# Patient Record
Sex: Female | Born: 1980 | Race: White | Hispanic: No | Marital: Single | Smoking: Current every day smoker
Health system: Southern US, Community
[De-identification: ages and names within clinical notes are randomized; demographics above are authoritative.]

## PROBLEM LIST (undated history)

## (undated) DIAGNOSIS — F419 Anxiety disorder, unspecified: Secondary | ICD-10-CM

## (undated) DIAGNOSIS — F32A Depression, unspecified: Secondary | ICD-10-CM

## (undated) DIAGNOSIS — F329 Major depressive disorder, single episode, unspecified: Secondary | ICD-10-CM

## (undated) DIAGNOSIS — F1120 Opioid dependence, uncomplicated: Secondary | ICD-10-CM

## (undated) DIAGNOSIS — G40909 Epilepsy, unspecified, not intractable, without status epilepticus: Secondary | ICD-10-CM

## (undated) HISTORY — PX: NO PAST SURGERIES: SHX2092

## (undated) HISTORY — DX: Opioid dependence, uncomplicated: F11.20

---

## 2005-07-31 ENCOUNTER — Emergency Department: Payer: Self-pay | Admitting: Unknown Physician Specialty

## 2006-03-10 ENCOUNTER — Emergency Department: Payer: Self-pay | Admitting: Emergency Medicine

## 2006-04-20 ENCOUNTER — Emergency Department: Payer: Self-pay | Admitting: Emergency Medicine
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
## 2008-05-23 ENCOUNTER — Ambulatory Visit: Payer: Self-pay | Admitting: Internal Medicine

## 2008-07-10 ENCOUNTER — Ambulatory Visit: Payer: Self-pay | Admitting: Internal Medicine

## 2008-07-12 ENCOUNTER — Emergency Department: Payer: Self-pay | Admitting: Emergency Medicine

## 2009-08-02 ENCOUNTER — Emergency Department: Payer: Self-pay | Admitting: Emergency Medicine

## 2010-09-12 ENCOUNTER — Emergency Department: Payer: Self-pay | Admitting: Internal Medicine

## 2010-09-17 ENCOUNTER — Emergency Department: Payer: Self-pay | Admitting: Emergency Medicine

## 2010-10-05 ENCOUNTER — Emergency Department: Payer: Self-pay | Admitting: Internal Medicine

## 2011-05-01 ENCOUNTER — Emergency Department: Payer: Self-pay | Admitting: Emergency Medicine

## 2011-12-01 ENCOUNTER — Emergency Department: Payer: Self-pay | Admitting: Emergency Medicine

## 2011-12-01 LAB — URINALYSIS, COMPLETE
Bacteria: NONE SEEN
Bilirubin,UR: NEGATIVE
Glucose,UR: NEGATIVE mg/dL (ref 0–75)
Nitrite: NEGATIVE
Ph: 7 (ref 4.5–8.0)
RBC,UR: 3 /HPF (ref 0–5)
Squamous Epithelial: 5

## 2012-12-25 ENCOUNTER — Emergency Department: Payer: Self-pay | Admitting: Emergency Medicine

## 2012-12-25 LAB — DRUG SCREEN, URINE
Amphetamines, Ur Screen: NEGATIVE (ref ?–1000)
Barbiturates, Ur Screen: NEGATIVE (ref ?–200)
Benzodiazepine, Ur Scrn: NEGATIVE (ref ?–200)
Cannabinoid 50 Ng, Ur ~~LOC~~: POSITIVE (ref ?–50)
Methadone, Ur Screen: NEGATIVE (ref ?–300)
Opiate, Ur Screen: POSITIVE (ref ?–300)
Phencyclidine (PCP) Ur S: NEGATIVE (ref ?–25)
Tricyclic, Ur Screen: NEGATIVE (ref ?–1000)

## 2012-12-25 LAB — COMPREHENSIVE METABOLIC PANEL
Albumin: 3.9 g/dL (ref 3.4–5.0)
Alkaline Phosphatase: 95 U/L (ref 50–136)
Anion Gap: 3 — ABNORMAL LOW (ref 7–16)
BUN: 14 mg/dL (ref 7–18)
Bilirubin,Total: 0.2 mg/dL (ref 0.2–1.0)
Calcium, Total: 8.9 mg/dL (ref 8.5–10.1)
Chloride: 105 mmol/L (ref 98–107)
Creatinine: 0.92 mg/dL (ref 0.60–1.30)
EGFR (African American): >60
EGFR (Non-African Amer.): >60
Glucose: 91 mg/dL (ref 65–99)
Osmolality: 278 (ref 275–301)
Potassium: 4.2 mmol/L (ref 3.5–5.1)
SGPT (ALT): 17 U/L (ref 12–78)
Sodium: 139 mmol/L (ref 136–145)

## 2012-12-25 LAB — CBC
HGB: 12.1 g/dL (ref 12.0–16.0)
MCH: 28.8 pg (ref 26.0–34.0)
MCHC: 33.6 g/dL (ref 32.0–36.0)
MCV: 86 fL (ref 80–100)
Platelet: 345 10*3/uL (ref 150–440)
RBC: 4.2 10*6/uL (ref 3.80–5.20)

## 2012-12-25 LAB — URINALYSIS, COMPLETE
Bilirubin,UR: NEGATIVE
Blood: NEGATIVE
Ketone: NEGATIVE
Protein: NEGATIVE
WBC UR: 2 /HPF (ref 0–5)

## 2012-12-25 LAB — PREGNANCY, URINE: Pregnancy Test, Urine: NEGATIVE m[IU]/mL

## 2012-12-25 LAB — ETHANOL: Ethanol %: <0.003 % (ref 0.000–0.080)

## 2013-03-24 IMAGING — CT CT HEAD WITHOUT CONTRAST
1 series · 16 of 30 positions shown, 20 images · non-contrast
Comparison: none

REASON FOR EXAM: fall dizzy
COMMENTS:   May transport without cardiac monitor

[Series 2: soft tissue · axial · 0.42mm/px · z∈[-148,-14]mm · 16 of 30 slices shown, 20 images]
[im 2/30  brain]
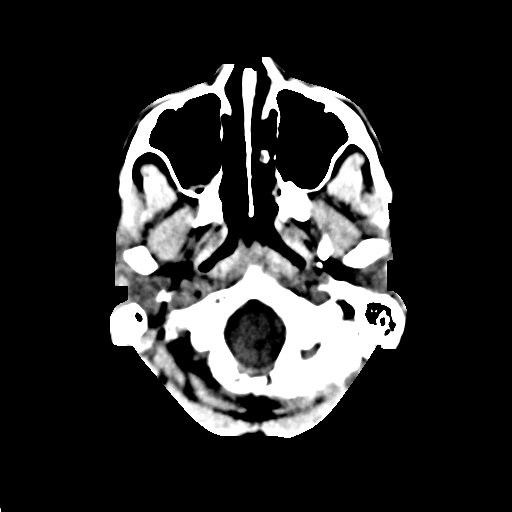
[im 2/30  bone]
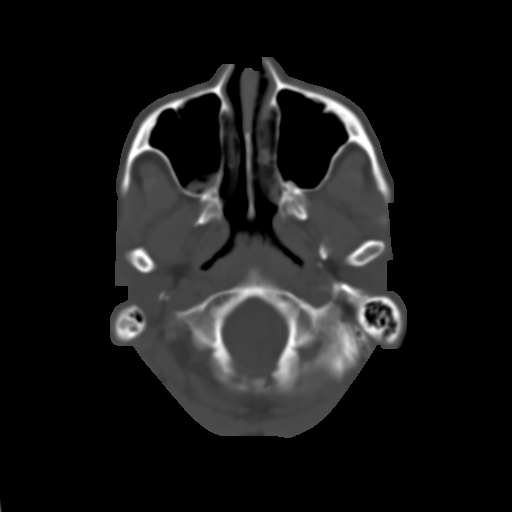
[im 4/30  brain]
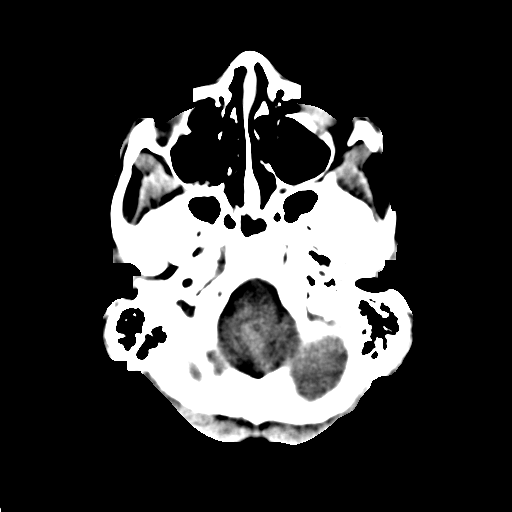
[im 6/30  brain]
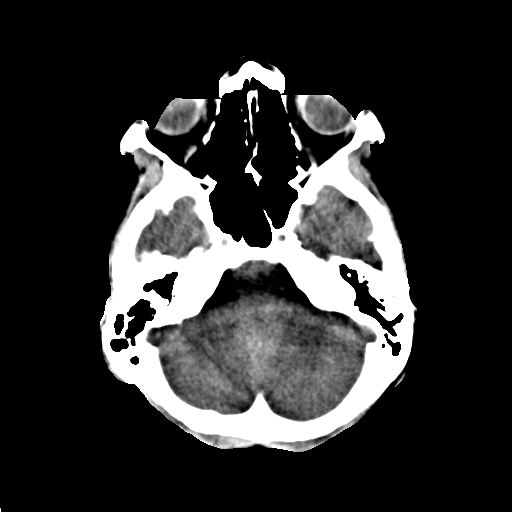
[im 8/30  brain]
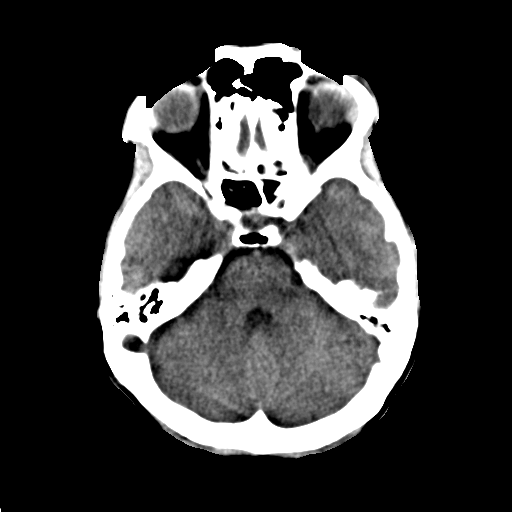
[im 9/30  brain]
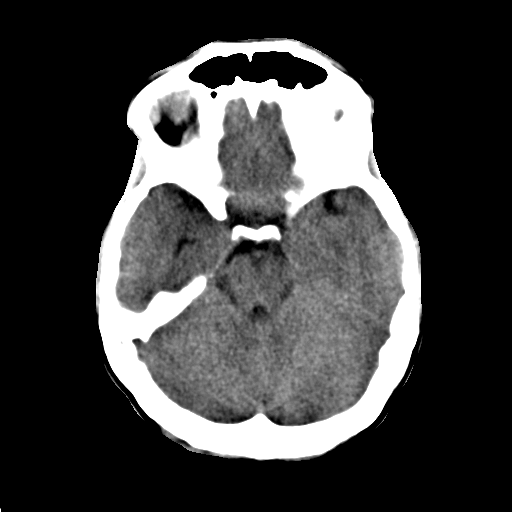
[im 9/30  bone]
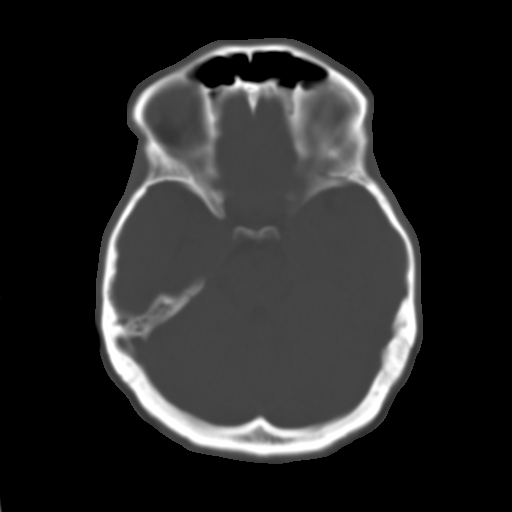
[im 11/30  brain]
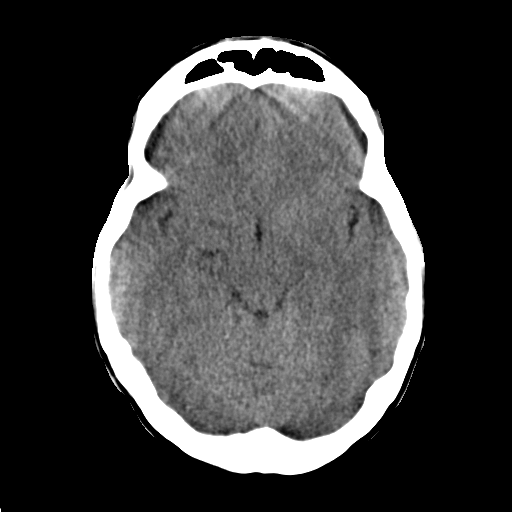
[im 13/30  brain]
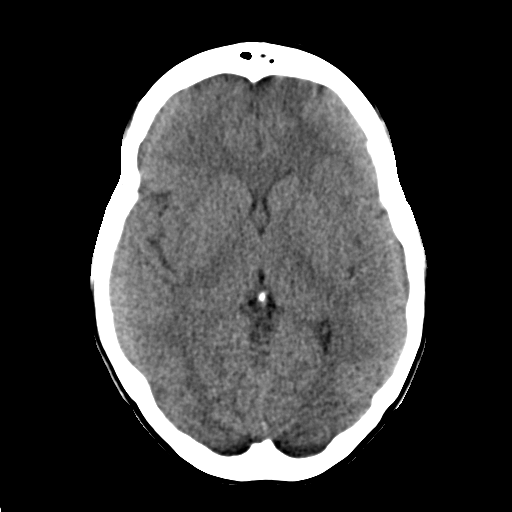
[im 15/30  brain]
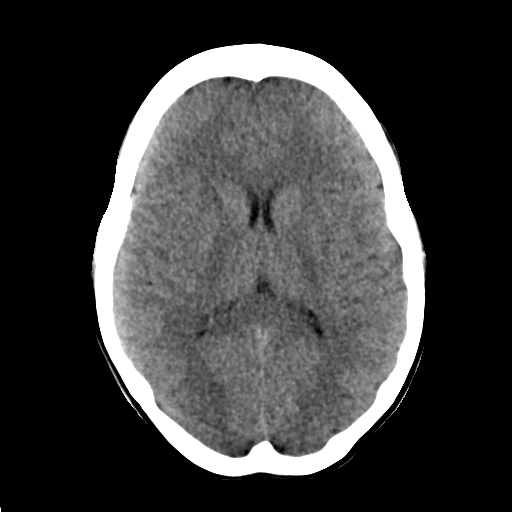
[im 16/30  brain]
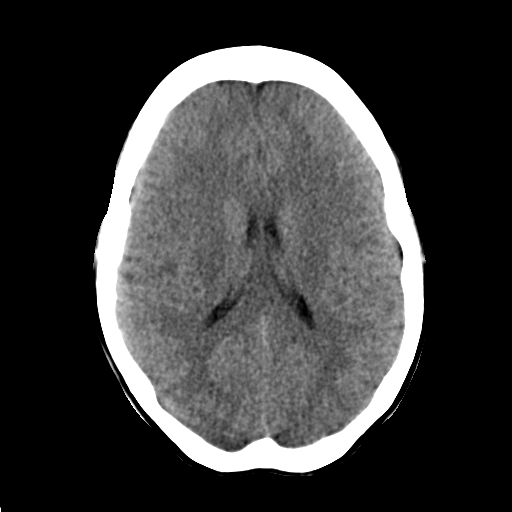
[im 16/30  bone]
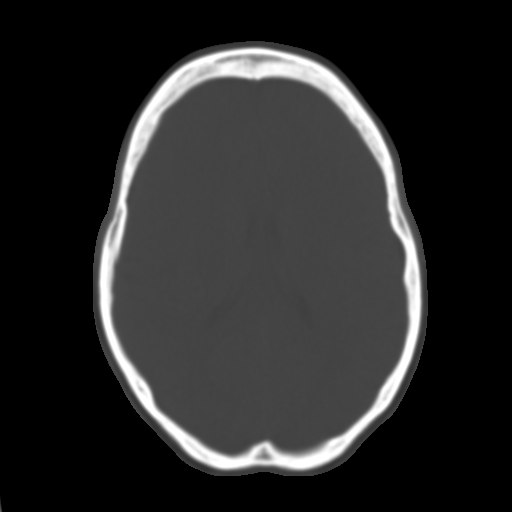
[im 18/30  brain]
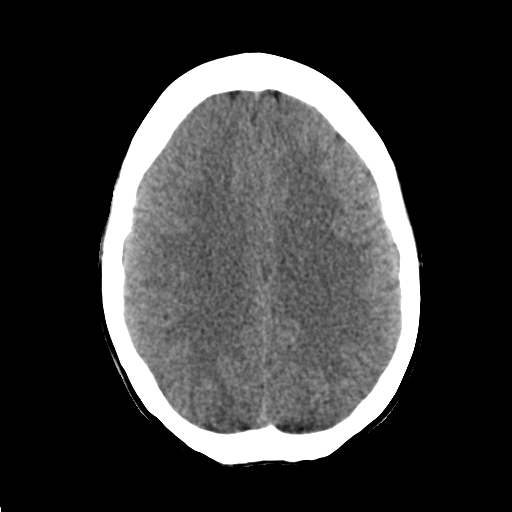
[im 20/30  brain]
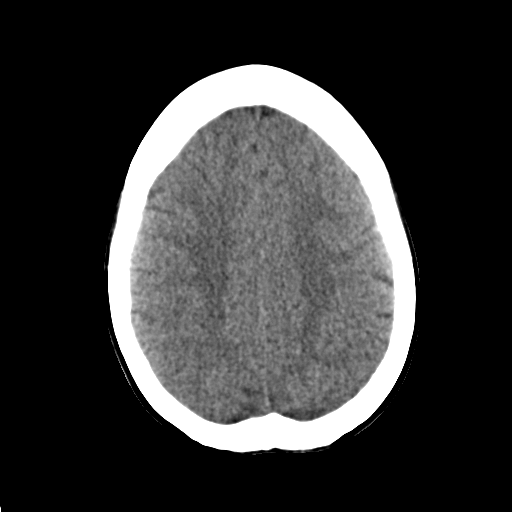
[im 22/30  brain]
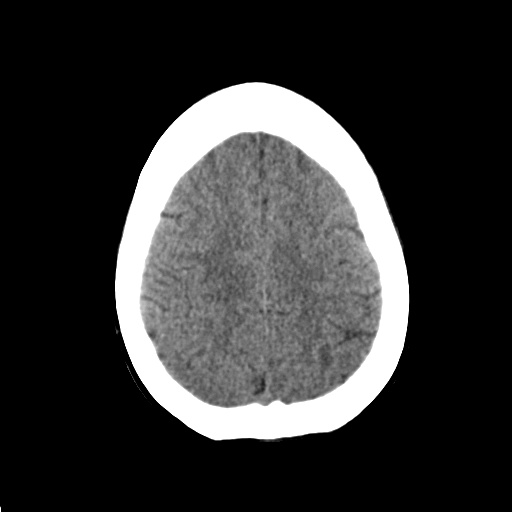
[im 23/30  brain]
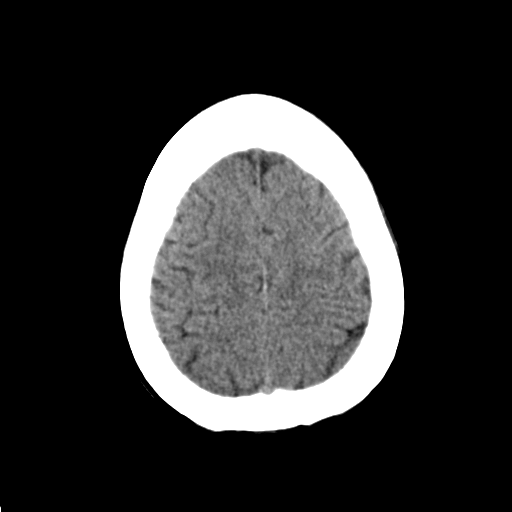
[im 23/30  bone]
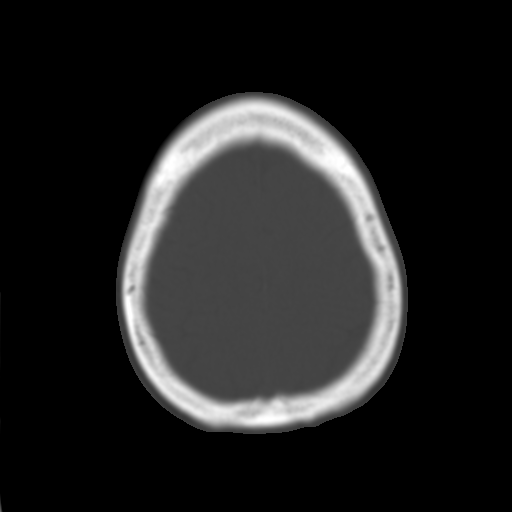
[im 25/30  brain]
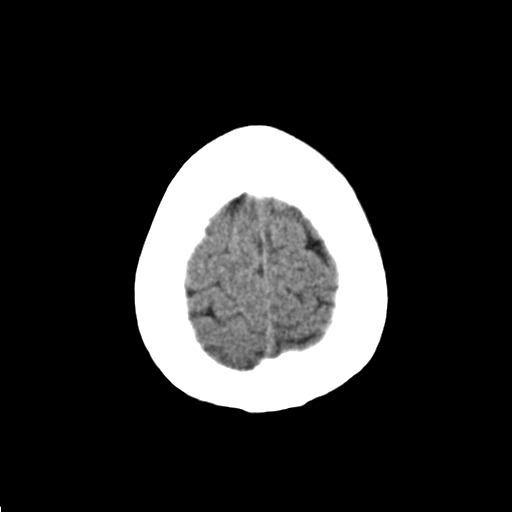
[im 27/30  brain]
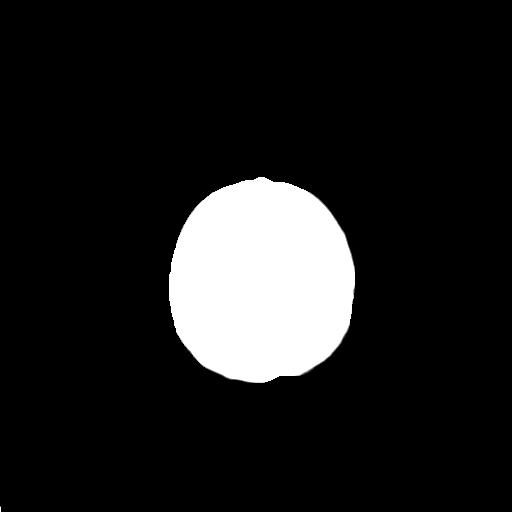
[im 29/30  brain]
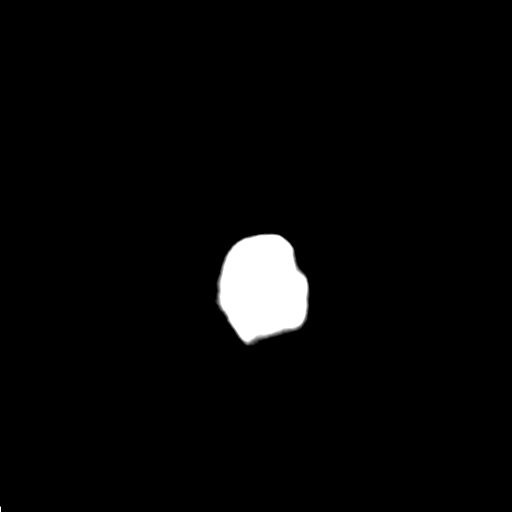

[16 of 30 positions shown; findings below may reference images not displayed]

PROCEDURE:     CT  - CT HEAD WITHOUT CONTRAST  - May 01, 2011  [DATE]

RESULT:     Axial noncontrast CT scanning was performed through the brain
with reconstructions at 5 mm intervals and slice thicknesses.

The ventricles are normal in size and position. There is no intracranial
hemorrhage nor intracranial mass effect. There are no abnormal intracranial
calcifications. There is no evidence of an evolving ischemic infarction. The
cerebellum and brainstem are normal in density.

At bone window settings I do not see evidence of an acute skull fracture.
The nasal bone is grossly intact. There is soft tissue density material
posteriorly in the right maxillary sinus.
IMPRESSION: I see no acute intracranial abnormality.

## 2013-07-22 ENCOUNTER — Emergency Department: Payer: Self-pay | Admitting: Emergency Medicine

## 2013-07-25 ENCOUNTER — Emergency Department: Payer: Self-pay | Admitting: Emergency Medicine

## 2013-07-29 ENCOUNTER — Emergency Department: Payer: Self-pay

## 2013-07-29 LAB — CBC
HCT: 38.6 % (ref 35.0–47.0)
HGB: 12.2 g/dL (ref 12.0–16.0)
MCH: 24.5 pg — AB (ref 26.0–34.0)
MCHC: 31.6 g/dL — AB (ref 32.0–36.0)
MCV: 77 fL — ABNORMAL LOW (ref 80–100)
Platelet: 304 10*3/uL (ref 150–440)
RBC: 4.99 10*6/uL (ref 3.80–5.20)
RDW: 17.2 % — ABNORMAL HIGH (ref 11.5–14.5)
WBC: 6.9 10*3/uL (ref 3.6–11.0)

## 2013-07-29 LAB — COMPREHENSIVE METABOLIC PANEL
Albumin: 3.5 g/dL (ref 3.4–5.0)
Alkaline Phosphatase: 79 U/L
Anion Gap: 6 — ABNORMAL LOW (ref 7–16)
BILIRUBIN TOTAL: 0.3 mg/dL (ref 0.2–1.0)
BUN: 13 mg/dL (ref 7–18)
CALCIUM: 9.2 mg/dL (ref 8.5–10.1)
CHLORIDE: 105 mmol/L (ref 98–107)
CO2: 28 mmol/L (ref 21–32)
Creatinine: 0.8 mg/dL (ref 0.60–1.30)
EGFR (African American): >60
EGFR (Non-African Amer.): >60
GLUCOSE: 103 mg/dL — AB (ref 65–99)
Osmolality: 278 (ref 275–301)
Potassium: 3.2 mmol/L — ABNORMAL LOW (ref 3.5–5.1)
SGOT(AST): 66 U/L — ABNORMAL HIGH (ref 15–37)
SGPT (ALT): 75 U/L (ref 12–78)
Sodium: 139 mmol/L (ref 136–145)
TOTAL PROTEIN: 7.3 g/dL (ref 6.4–8.2)

## 2013-07-29 LAB — URINALYSIS, COMPLETE
Bacteria: NONE SEEN
Bilirubin,UR: NEGATIVE
Glucose,UR: NEGATIVE mg/dL (ref 0–75)
Ketone: NEGATIVE
LEUKOCYTE ESTERASE: NEGATIVE
NITRITE: NEGATIVE
PH: 5 (ref 4.5–8.0)
Protein: 30
RBC,UR: 86 /HPF (ref 0–5)
Specific Gravity: 1.024 (ref 1.003–1.030)
Squamous Epithelial: 10

## 2013-07-29 LAB — DRUG SCREEN, URINE
AMPHETAMINES, UR SCREEN: NEGATIVE (ref ?–1000)
Barbiturates, Ur Screen: NEGATIVE (ref ?–200)
Benzodiazepine, Ur Scrn: POSITIVE (ref ?–200)
Cannabinoid 50 Ng, Ur ~~LOC~~: POSITIVE (ref ?–50)
Cocaine Metabolite,Ur ~~LOC~~: POSITIVE (ref ?–300)
MDMA (Ecstasy)Ur Screen: NEGATIVE (ref ?–500)
Methadone, Ur Screen: POSITIVE (ref ?–300)
Opiate, Ur Screen: POSITIVE (ref ?–300)
PHENCYCLIDINE (PCP) UR S: NEGATIVE (ref ?–25)
TRICYCLIC, UR SCREEN: NEGATIVE (ref ?–1000)

## 2013-07-29 LAB — ETHANOL

## 2013-07-29 LAB — SALICYLATE LEVEL: Salicylates, Serum: 2 mg/dL

## 2013-07-29 LAB — ACETAMINOPHEN LEVEL

## 2013-10-30 ENCOUNTER — Ambulatory Visit (HOSPITAL_COMMUNITY)
Admission: AD | Admit: 2013-10-30 | Discharge: 2013-10-30 | Disposition: A | Discharge status: Home / Self Care | Payer: Self-pay | Source: Other Acute Inpatient Hospital | Attending: Internal Medicine | Admitting: Internal Medicine

## 2013-10-30 ENCOUNTER — Inpatient Hospital Stay: Payer: Self-pay | Admitting: Internal Medicine

## 2013-10-30 DIAGNOSIS — L02519 Cutaneous abscess of unspecified hand: Secondary | ICD-10-CM | POA: Insufficient documentation

## 2013-10-30 DIAGNOSIS — L03119 Cellulitis of unspecified part of limb: Principal | ICD-10-CM

## 2013-10-30 LAB — PLATELET COUNT: Platelet: 184 10*3/uL (ref 150–440)

## 2013-10-30 LAB — CREATININE, SERUM
CREATININE: 0.9 mg/dL (ref 0.60–1.30)
EGFR (African American): >60
EGFR (Non-African Amer.): >60

## 2013-10-30 LAB — VANCOMYCIN, TROUGH: Vancomycin, Trough: 15 ug/mL (ref 10–20)

## 2013-10-31 LAB — CBC WITH DIFFERENTIAL/PLATELET
BASOS ABS: 1 %
Bands: 4 %
Eosinophil: 1 %
HCT: 35.2 % (ref 35.0–47.0)
HGB: 11.2 g/dL — ABNORMAL LOW (ref 12.0–16.0)
LYMPHS PCT: 46 %
MCH: 24.3 pg — ABNORMAL LOW (ref 26.0–34.0)
MCHC: 31.8 g/dL — AB (ref 32.0–36.0)
MCV: 77 fL — AB (ref 80–100)
MONOS PCT: 4 %
Platelet: 170 10*3/uL (ref 150–440)
RBC: 4.61 10*6/uL (ref 3.80–5.20)
RDW: 19.1 % — AB (ref 11.5–14.5)
SEGMENTED NEUTROPHILS: 35 %
Variant Lymphocyte - H1-Rlymph: 9 %
WBC: 6.3 10*3/uL (ref 3.6–11.0)

## 2013-10-31 LAB — BASIC METABOLIC PANEL
ANION GAP: 2 — AB (ref 7–16)
BUN: 8 mg/dL (ref 7–18)
CHLORIDE: 110 mmol/L — AB (ref 98–107)
CO2: 26 mmol/L (ref 21–32)
CREATININE: 0.98 mg/dL (ref 0.60–1.30)
Calcium, Total: 8 mg/dL — ABNORMAL LOW (ref 8.5–10.1)
EGFR (African American): >60
EGFR (Non-African Amer.): >60
Glucose: 88 mg/dL (ref 65–99)
Osmolality: 273 (ref 275–301)
Potassium: 3.9 mmol/L (ref 3.5–5.1)
Sodium: 138 mmol/L (ref 136–145)

## 2013-11-01 LAB — VANCOMYCIN, TROUGH: VANCOMYCIN, TROUGH: 19 ug/mL (ref 10–20)

## 2013-11-05 LAB — CULTURE, BLOOD (SINGLE)

## 2014-06-26 NOTE — Discharge Summary (Signed)
PATIENT NAME:  Kimberly Hayes, Kimberly Hayes MR#:  [id number] DATE OF BIRTH:  [birth year]  DATE OF ADMISSION:  10/30/2013  DATE OF DISCHARGE:  11/01/2013   DISCHARGE DIAGNOSES:  1.  Right arm antecubital fossa abscess.  2.  Sepsis.  3.  IV drug use.  4.  Tobacco use.  5.  Mild anemia.   IMAGING STUDIES: None.   ADMITTING HISTORY AND PHYSICAL:  Please see detailed H and P dictated by Dr. Juliene PinaMody. In brief, a 34 year old female patient with history of IV drug use, mild anemia, admitted [facility] the hospital [facility] a [facility] abscess.  The patient was found to have sepsis along with right arm abscess secondary to IV drug use and was transferred here secondary to proximity from her home and to family.   HOSPITAL COURSE:   Right arm abscess. This is secondary to IV drug use. The patient was started on IV vancomycin and Zosyn.  Blood cultures 1 out of 2 and [facility] were positive for gram-positive cocci initially.  Zosyn was discontinued. She was continued on vancomycin. I have contacted the lab on day of discharge.  She does have a micrococcus growing out in 1 out of 2 bottles, which is thought to be possibly contamination.  Repeat blood cultures were drawn here, which are no growth to date.  Also, her wound culture do not have any growth.   At this point, I will switch her to Bactrim for another week.  She does not have any discharge.  Her abscess has resolved and she will follow up with primary care physician in a week.  The patient has been given information regarding the rehabilitation for her IV drug use after discussing with the case manager.  She has been counseled to quit smoking and to quit IV drugs.    Prior to discharge, the patient is site of I and D on the right upper extremity has no discharge, has closed.  Lungs sound clear. S1, S2 heard without any murmurs.   DISCHARGE MEDICATIONS:  1.  Bactrim DS 1 tablet twice a day for a week.  2.  Tylenol 650 mg oral every  6 hours as needed for pain, fever.  3.  Nicotine transdermal 40 mcg once a day.  4.  Zofran ODT 4 mg oral 3 times a day as needed for nausea or vomiting.  5.  Clonidine 0.1 mg oral 2 times as needed for anxiety and nervousness.  6.  Valium 2 mg oral 3 times a day as needed for anxiety. Baclofen 10 mg oral 3 times as day as needed.   DISCHARGE INSTRUCTIONS:  Activity as tolerated. Follow up with primary care physician in 1 to 2 weeks, quit IV drug use and smoking.   Time spent on day of discharge and discharge activity was 42 minutes.     ____________________________ Molinda BailiffSrikar R. Leita Lindbloom, MD srs:DT D: 11/02/2013 13:01:44 ET T: 11/02/2013 17:51:52 ET JOB#: [id number]  cc: Wardell HeathSrikar R. Haizley Cannella, MD, <Dictator> Orie FishermanSRIKAR R Cordai Rodrigue MD ELECTRONICALLY SIGNED 11/19/2013 16:28

## 2014-06-26 NOTE — H&P (Signed)
PATIENT NAME:  Kimberly Hayes, Kimberly Hayes MR#:  [id number] DATE OF BIRTH:  17-Oct-1980  DATE OF ADMISSION:  10/30/2013  PRIMARY CARE PHYSICIAN: None.     CHIEF COMPLAINT: The patient was sent as a direct admit from [facility] for a right arm abscess.   HISTORY OF PRESENT ILLNESS: This is a 34 year old female with a history of IV drug use, alcohol abuse, tobacco dependence who admitted on Wednesday to [facility] for a right arm abscess. At presentation she had a temperature of 103.1 and was tachycardic. She had no elevation in her white blood cell count. She is been surgically admitted per the patient's request to be closer to her family. Blood cultures so far are 1 out of 2 GPCs. She has been on vancomycin and Zosyn.  The lab phone number in [place] is [phone].   REVIEW OF SYSTEMS: CONSTITUTIONAL: She had a fever on the day of admission and none since. No weakness or fatigue.   EYES: No blurred or double vision, glaucoma, or cataracts.  ENT: No hearing loss, postnasal drip, sinus pain.  RESPIRATORY: No cough, wheezing, hemoptysis, COPD.   CARDIOVASCULAR: No chest pain, orthopnea, edema, arrhythmia, dyspnea on exertion, palpitations, syncope.  GASTROINTESTINAL: Some nausea. No vomiting, diarrhea, abdominal pain, melena, or ulcers.  GENITOURINARY: No dysuria or hematuria.  ENDOCRINE: No polyuria or polydipsia.  HEMATOLOGIC AND LYMPHATIC: No anemia or easy bruising.  SKIN: She has an abscess in the right forearm. No acne or rashes.  MUSCULOSKELETAL: No limited activity, arthritis.  NEUROLOGIC: No history of CVA, TIA, or seizures.  PSYCHIATRIC: No history of anxiety or depression.   PAST MEDICAL HISTORY: 1.  IV drug use.  2.  Tobacco dependence.  3.  Alcohol abuse.   SOCIAL HISTORY: The patient smokes about a pack a day. She uses multiple IV drugs, including heroin, Opana. She occasionally drinks. She was a heavy drinker prior to March.   FAMILY HISTORY: Unknown, as  per the patient.   PAST SURGICAL HISTORY: None.   ALLERGIES: No known drug allergies.   PHYSICAL EXAMINATION:  MOST CURRENT VITAL SIGNS: Temperature is 98.4, pulse 80, blood pressure 110/56, 100% on room air.  GENERAL: The patient is alert, oriented, not in acute distress.  HEENT: Head is atraumatic. Pupils round and reactive. Sclerae anicteric. Mucous membranes are moist. Oropharynx is clear.   NECK: Supple without JVD, carotid bruit, or enlarged thyroid.  CARDIOVASCULAR: Regular rate and rhythm. No murmurs, gallops, or rubs. PMI is not displaced.  LUNGS: Clear to auscultation without crackles, rales, rhonchi, or wheezing. Normal to percussion.  ABDOMEN: Bowel sounds positive. Nontender, nondistended. No hepatosplenomegaly. No rebound or guarding.  EXTREMITIES: No clubbing,  cyanosis, or edema.  NEUROLOGIC: Cranial nerves II through XII are intact. No focal deficits.  SKIN: She has packing in her right arm near the elbow crease. Does not have any purulent discharge. This abscess was incised and drained in the ER on Wednesday in [place].   LABORATORIES: Most recent laboratories from [facility]: Gram stain 1 out of 2 blood cultures positive. Sodium 140, potassium 3.6, chloride 107, bicarbonate 27, BUN 6, creatinine 0.62, calcium 8.5, neutrophils 33.2. White blood cells 4.2, hemoglobin 11, hematocrit 35, platelets are 191,000. Once again, this is from [facility].   ASSESSMENT AND PLAN: A 34 year old female who was transferred from [facility] as per the patient's request, presented on Wednesday with sepsis secondary to IV drug use and a right arm abscess. 1.  Sepsis, seems  to have resolved. The patient is afebrile without any elevated white blood cell count. This is secondary to her right arm abscesses.  2.  Right arm abscess. As per the ER the [facility], she is growing out 1 out of 2 GPCs. She is currently on  vancomycin and Zosyn. Will need to call the microbiology lab there at [phone] for the final blood cultures and sensitivities.  For now, she will continue the Zosyn and vancomycin. She will need home health prior to discharge for the packing.  3.  Intravenous drug use. The patient does want to quit. Her mother apparently is going to help her find a place after discharge to go to for rehabilitation.  4.  Tobacco dependence. The patient was counseled for 4 minutes regarding stopping smoking. She does want to quit. She would like a nicotine patch.  5.  Alcohol abuse. Does not seem to be in withdrawal at this time. Last drink was several days ago, and she says she is not a heavy drinker. Will monitor for withdrawal.  6.  The patient is full code status.   TIME SPENT: Approximately 50 minutes.    ____________________________ Janyth ContesSital P. Juliene PinaMody, MD spm:at D: 10/30/2013 12:46:50 ET T: 10/30/2013 13:15:41 ET JOB#: [id number]  cc: Maximum Reiland P. Juliene PinaMody, MD, <Dictator> Janyth ContesSITAL P Kaileb Monsanto MD ELECTRONICALLY SIGNED 10/30/2013 16:10

## 2014-06-26 NOTE — Consult Note (Signed)
PATIENT NAME:  Kimberly Hayes, Kimberly Hayes MR#:  [id number] DATE OF BIRTH:  12/04/1980  DATE OF CONSULTATION:  07/30/2013  REFERRING PHYSICIAN:  Elaina Hoops, MD. CONSULTING PHYSICIAN:  Ardeen Fillers. Garnetta Buddy, MD.  REASON FOR CONSULTATION: Detox from opioids and heroin and had multiple detox stays in the past.   HISTORY OF PRESENT ILLNESS: The patient is a 35 year old female who presented to the ED as she was requesting detox from drugs, including heroin, pills and cocaine. She reported that she has been using IV drugs for the past couple of months. She stated that she has been using half a gram of cocaine as well as heroin on a daily basis. She was also using pills including Percocet and Opana. Reported that she was clean for six months recently and relapsed on the drugs. Using 2 bags to 2 grams of heroin IV, an average of 1 gram daily. She was also using 80 mg of OxyContin and 3 grams of  Opana  daily.  Stated that she went to the rehab program at [facility] as well as to the [facility] but she was using the drugs over there.   The patient reported that she came to hospital as she wanted help as she was using IV drugs in her breasts and got an abscess. She was given antibiotics, but she is not feeling better. However, now she feels that she wants to be discharged from the hospital as she does not want to stay here any longer. She is looking to get into a longterm rehabilitation program. She stated that she feels depressed, hopeless, helpless at times, but she currently denies having any suicidal or homicidal ideations or plans. The patient reported that she is also having some withdrawal symptoms including feeling restless, nausea, with upset stomach.  She denies having any vomiting. She reported that she has completed 2 to 3 rehabilitation programs, but she was unable to maintain any periods of sobriety. The patient reported that she does not want to stay in the hospital at this time, and she will go back and  is able to handle her own withdrawal symptoms as she has done in the past. She is not interesting in being admitted to the hospital at this time which was offered to her, as she is going into withdrawals from the drugs, but she stated that she will handle it on her own in the community. She is not looking for any medications at this time.   PAST PSYCHIATRIC HISTORY: The patient has history of at least 2 to 3 detox programs in the past including at [address] and [facility]. She reported that she also attended Suboxone clinic at [facility] in February  2014. She was in the methadone clinic in [place], [place] and was no longer able to afford the same clinic. She reported that she wishes to go to a longterm rehabilitation program. She denied any history of suicide attempts in the past.   FAMILY HISTORY: The patient denied any family history of drug use or mental illness.   ALLERGIES: No known drug allergies.   SOCIAL HISTORY: The patient stated that she might be homeless at this time. Reported that she cannot return home, but her parents are currently supportive.   REVIEW OF SYSTEMS:  CONSTITUTIONAL: Denies any fever or chills. No weight changes.  EYES: No double or blurred vision.  RESPIRATORY: No shortness of breath or cough.  CARDIOVASCULAR: No chest pain or orthopnea.  GASTROINTESTINAL: Having some abdominal pain and nausea but  no vomiting or diarrhea.  GENITOURINARY: No incontinence or frequency.  ENDOCRINE: No heat or cold intolerance.  LYMPHATIC: No anemia or easy bruising.  INTEGUMENTARY: No acne or rash.  MUSCULOSKELETAL: Having muscle and joint pain.  NEUROLOGIC: No tingling or weakness.   PHYSICAL EXAMINATION:  VITAL SIGNS: Temperature of 98.3, pulse 80, respirations 18, and blood pressure 123/68.   LABORATORY DATA: Glucose 92, BUN 6, creatinine 0.83, sodium 140, potassium 3.7, chloride 108, bicarbonate 26, anion gap 6, osmolality 276, calcium 9.0. Blood alcohol level less than 3. Protein 8.1,  albumin 4.2, bilirubin 0.6. Alkaline phosphatase 70, AST 62, ALT 38. UDS positive for cocaine, cannabinoids, benzodiazepines, opioids, methadone.  WBC 6.9, RBC 4.9, hemoglobin 12.2, hematocrit 38.6, MCV 77, RDW 17.2.   MENTAL STATUS EXAMINATION: The patient is a moderately built female who was lying in the bed. She appears well nourished. Muscle tone appears normal with no flaccidity. Gait and station were normal. Speech was low in tone and volume. Thought process was logical, goal-directed. Thought content was nondelusional. She denied having any suicidal or homicidal ideations or plans. No loose associations are noted.  Her insight and judgment regarding her drug use was poor. She was awake, alert and oriented x 3. Her recent and remote memory were intact. Her attention span and concentration were normal. Her language including naming objects and repeating phrases is appropriate. Her mood was okay and affect was blunted.   DIAGNOSTIC IMPRESSION: AXIS I: 1. Polysubstance dependence.                2. Opioid withdrawal.  AXIS II: None.  AXIS III: Recent infection of the breast.   TREATMENT PLAN: Offered the patient about admission to the inpatient behavioral health unit as she is currently going withdrawals from the opioids and she is feeling depressed, but she declined the same. She reported that she wants to be discharged at this time as she is looking for admission to a longterm rehabilitation program and she will look into the same. She does not meet the criteria for being involuntary commitment at this time. The patient does not need any medications for her withdrawal symptoms. She will be given information about the [facility] and will be discharged safely from the hospital.   I advised the patient to come back if she noticed worsening of her symptoms and she demonstrated understanding.   Thank you for allowing me to participate in the care of this patient.   ____________________________ Ardeen FillersUzma S.  Garnetta BuddyFaheem, MD usf:sg D: 07/30/2013 13:38:00 ET T: 07/30/2013 13:51:17 ET JOB#: [id number]  cc: Ardeen FillersUzma S. Garnetta BuddyFaheem, MD, <Dictator> Rhunette CroftUZMA S Tabatha Razzano MD ELECTRONICALLY SIGNED 08/04/2013 16:00

## 2014-10-04 DIAGNOSIS — F1120 Opioid dependence, uncomplicated: Secondary | ICD-10-CM

## 2014-10-04 HISTORY — DX: Opioid dependence, uncomplicated: F11.20

## 2016-10-08 DIAGNOSIS — Z653 Problems related to other legal circumstances: Secondary | ICD-10-CM | POA: Insufficient documentation

## 2016-10-08 LAB — HM HIV SCREENING LAB: HM HIV Screening: NEGATIVE

## 2016-10-08 LAB — HM PAP SMEAR: HM Pap smear: NEGATIVE

## 2017-06-18 ENCOUNTER — Ambulatory Visit
Admission: EM | Admit: 2017-06-18 | Discharge: 2017-06-18 | Disposition: A | Discharge status: Home / Self Care | Payer: Managed Care, Other (non HMO) | Attending: Family Medicine | Admitting: Family Medicine

## 2017-06-18 DIAGNOSIS — J029 Acute pharyngitis, unspecified: Secondary | ICD-10-CM

## 2017-06-18 DIAGNOSIS — R5383 Other fatigue: Secondary | ICD-10-CM

## 2017-06-18 HISTORY — DX: Anxiety disorder, unspecified: F41.9

## 2017-06-18 HISTORY — DX: Depression, unspecified: F32.A

## 2017-06-18 HISTORY — DX: Major depressive disorder, single episode, unspecified: F32.9

## 2017-06-18 LAB — RAPID STREP SCREEN (MED CTR MEBANE ONLY): Streptococcus, Group A Screen (Direct): NEGATIVE

## 2017-06-18 MED ORDER — LIDOCAINE VISCOUS 2 % MT SOLN: OROMUCOSAL | 0 refills | Status: DC

## 2017-06-18 NOTE — ED Triage Notes (Signed)
Pt with sore throat x Friday. Now with laryngitis. Pain 4/10

## 2017-06-18 NOTE — ED Provider Notes (Signed)
[facility]    CSN: [id number] Arrival date & time: 06/18/17  1301     History   Chief Complaint Chief Complaint  Patient presents with  . Sore Throat    HPI Kimberly Hayes is a 37 y.o. female.   The history is provided by the patient.  URI  Presenting symptoms: fatigue and sore throat   Severity:  Moderate Onset quality:  Sudden Duration:  4 days Timing:  Constant Progression:  Unchanged Chronicity:  New Relieved by:  Nothing Ineffective treatments:  OTC medications Associated symptoms: no wheezing   Risk factors: sick contacts   Risk factors: not elderly, no chronic cardiac disease, no chronic kidney disease, no chronic respiratory disease, no diabetes mellitus, no immunosuppression, no recent illness and no recent travel     Past Medical History:  Diagnosis Date  . Anxiety   . Depression     There are no active problems to display for this patient.   History reviewed. No pertinent surgical history.  OB History   None      Home Medications    Prior to Admission medications   Medication Sig Start Date End Date Taking? Authorizing Provider  Etonogestrel (NEXPLANON [facility]) Inject into the skin.   Yes [provider]  Buprenorphine HCl-Naloxone HCl 8-2 MG FILM DISSOLVE 1 FILM UNDER THE TONGUE TWO TIMES A DAY 06/17/17   [provider]  citalopram (CELEXA) 40 MG tablet TAKE 1 2 TABLET BY MOUTH TWO TIMES A DAY 05/24/17   [provider]  gabapentin (NEURONTIN) 300 MG capsule Take 300 mg by mouth 3 (three) times daily. 05/24/17   [provider]  lidocaine (XYLOCAINE) 2 % solution 20 ml gargle and spit q 6 hours prn 06/18/17   Payton Mccallum, MD    Family History History reviewed. No pertinent family history.  Social History Social History   Tobacco Use  . Smoking status: Former Games developer  . Smokeless tobacco: Never Used  Substance Use Topics  . Alcohol use: Not Currently    Frequency: Never  . Drug use: Not  Currently     Allergies   Patient has no known allergies.   Review of Systems Review of Systems  Constitutional: Positive for fatigue.  HENT: Positive for sore throat.   Respiratory: Negative for wheezing.      Physical Exam Triage Vital Signs ED Triage Vitals  Enc Vitals Group     BP 06/18/17 1315 116/75     Pulse Rate 06/18/17 1315 68     Resp 06/18/17 1315 18     Temp 06/18/17 1315 98.6 F (37 C)     Temp src --      SpO2 06/18/17 1315 99 %     Weight 06/18/17 1316 182 lb (82.6 kg)     Height 06/18/17 1316 5\' 4"  (1.626 m)     Head Circumference --      Peak Flow --      Pain Score 06/18/17 1316 4     Pain Loc --      Pain Edu? --      Excl. in GC? --    No data found.  Updated Vital Signs BP 116/75 (BP Location: Left Arm)   Pulse 68   Temp 98.6 F (37 C)   Resp 18   Ht 5\' 4"  (1.626 m)   Wt 182 lb (82.6 kg)   SpO2 99%   BMI 31.24 kg/m   Visual Acuity Right Eye Distance:  Left Eye Distance:   Bilateral Distance:    Right Eye Near:   Left Eye Near:    Bilateral Near:     Physical Exam  Constitutional: She appears well-developed and well-nourished. No distress.  HENT:  Head: Normocephalic and atraumatic.  Right Ear: Tympanic membrane, external ear and ear canal normal.  Left Ear: Tympanic membrane, external ear and ear canal normal.  Nose: Mucosal edema and rhinorrhea present. No nose lacerations, sinus tenderness, nasal deformity, septal deviation or nasal septal hematoma. No epistaxis.  No foreign bodies. Right sinus exhibits no maxillary sinus tenderness and no frontal sinus tenderness. Left sinus exhibits no maxillary sinus tenderness and no frontal sinus tenderness.  Mouth/Throat: Uvula is midline and mucous membranes are normal. Posterior oropharyngeal erythema present. No oropharyngeal exudate, posterior oropharyngeal edema or tonsillar abscesses. No tonsillar exudate.  Eyes: Conjunctivae are normal. Right eye exhibits no discharge. Left eye  exhibits no discharge. No scleral icterus.  Neck: Normal range of motion. Neck supple. No thyromegaly present.  Cardiovascular: Normal rate, regular rhythm and normal heart sounds.  Pulmonary/Chest: Effort normal and breath sounds normal. No respiratory distress. She has no wheezes. She has no rales.  Lymphadenopathy:    She has no cervical adenopathy.  Skin: She is not diaphoretic.  Nursing note and vitals reviewed.    UC Treatments / Results  Labs (all labs ordered are listed, but only abnormal results are displayed) Labs Reviewed  RAPID STREP SCREEN ([facility] ONLY)  CULTURE, GROUP A STREP [facility])    EKG None Radiology No results found.  Procedures Procedures (including critical care time)  Medications Ordered in UC Medications - No data to display   Initial Impression / Assessment and Plan / UC Course  I have reviewed the triage vital signs and the nursing notes.  Pertinent labs & imaging results that were available during my care of the patient were reviewed by me and considered in my medical decision making (see chart for details).       Final Clinical Impressions(s) / UC Diagnoses   Final diagnoses:  Viral pharyngitis    ED Discharge Orders        Ordered    lidocaine (XYLOCAINE) 2 % solution     06/18/17 1348     1. Lab result and diagnosis reviewed with patient 2. rx as per orders above; reviewed possible side effects, interactions, risks and benefits  3. Recommend supportive treatment with  4. Follow-up prn if symptoms worsen or don't improve  Controlled Substance Prescriptions [place] Controlled Substance Registry consulted? Not Applicable   Payton Mccallumonty, Damauri Minion, MD 06/18/17 [phone]

## 2017-06-21 LAB — CULTURE, GROUP A STREP (THRC)

## 2017-08-16 ENCOUNTER — Ambulatory Visit: Payer: Managed Care, Other (non HMO) | Admitting: Family Medicine

## 2017-08-16 ENCOUNTER — Other Ambulatory Visit
Admission: RE | Admit: 2017-08-16 | Discharge: 2017-08-16 | Disposition: A | Discharge status: Home / Self Care | Payer: Managed Care, Other (non HMO) | Source: Ambulatory Visit | Attending: Family Medicine | Admitting: Family Medicine

## 2017-08-16 ENCOUNTER — Encounter: Payer: Self-pay | Admitting: Family Medicine

## 2017-08-16 VITALS — BP 112/76 | HR 64 | Resp 16 | Ht 64.0 in | Wt 163.8 lb

## 2017-08-16 DIAGNOSIS — R109 Unspecified abdominal pain: Secondary | ICD-10-CM

## 2017-08-16 DIAGNOSIS — Z87898 Personal history of other specified conditions: Secondary | ICD-10-CM | POA: Diagnosis not present

## 2017-08-16 DIAGNOSIS — Z7689 Persons encountering health services in other specified circumstances: Secondary | ICD-10-CM

## 2017-08-16 DIAGNOSIS — Z8742 Personal history of other diseases of the female genital tract: Secondary | ICD-10-CM

## 2017-08-16 LAB — URINALYSIS, ROUTINE W REFLEX MICROSCOPIC
BILIRUBIN URINE: NEGATIVE
Glucose, UA: NEGATIVE mg/dL
HGB URINE DIPSTICK: NEGATIVE
KETONES UR: 15 mg/dL — AB
Leukocytes, UA: NEGATIVE
NITRITE: NEGATIVE
PROTEIN: NEGATIVE mg/dL
SPECIFIC GRAVITY, URINE: 1.02 (ref 1.005–1.030)
pH: 6 (ref 5.0–8.0)

## 2017-08-16 LAB — CBC WITH DIFFERENTIAL/PLATELET
Basophils Absolute: 0 10*3/uL (ref 0–0.1)
Basophils Relative: 1 %
EOS ABS: 0.1 10*3/uL (ref 0–0.7)
EOS PCT: 2 %
HCT: 38.4 % (ref 35.0–47.0)
Hemoglobin: 13 g/dL (ref 12.0–16.0)
LYMPHS ABS: 1.4 10*3/uL (ref 1.0–3.6)
Lymphocytes Relative: 33 %
MCH: 29.7 pg (ref 26.0–34.0)
MCHC: 33.9 g/dL (ref 32.0–36.0)
MCV: 87.5 fL (ref 80.0–100.0)
MONOS PCT: 5 %
Monocytes Absolute: 0.2 10*3/uL (ref 0.2–0.9)
NEUTROS PCT: 59 %
Neutro Abs: 2.6 10*3/uL (ref 1.4–6.5)
PLATELETS: 180 10*3/uL (ref 150–440)
RBC: 4.39 MIL/uL (ref 3.80–5.20)
RDW: 14.3 % (ref 11.5–14.5)
WBC: 4.4 10*3/uL (ref 3.6–11.0)

## 2017-08-16 NOTE — Progress Notes (Signed)
Name: Kimberly Hayes   MRN: [id number]    DOB: October 09, 1980   Date:08/16/2017       Progress Note  Subjective  Chief Complaint  Chief Complaint  Patient presents with  . Establish Care  . Abdominal Pain    1 month of pain R side after vomiting for 2 days     Patient presents to establish care.  Abdominal Pain  This is a new problem. The current episode started more than 1 month ago. The onset quality is gradual. The problem occurs constantly. The problem has been waxing and waning. The pain is located in the RLQ. The pain is moderate. The quality of the pain is colicky. Associated symptoms include weight loss. Pertinent negatives include no anorexia, arthralgias, belching, constipation, diarrhea, dysuria, fever, flatus, frequency, headaches, hematochezia, hematuria, melena, myalgias, nausea or vomiting. Nothing aggravates the pain. The treatment provided moderate relief.    No problem-specific Assessment & Plan notes found for this encounter.   Past Medical History:  Diagnosis Date  . Anxiety   . Depression   . Opiate addiction (HCC) 10/04/2014   Heroin and Opiate     History reviewed. No pertinent surgical history.  History reviewed. No pertinent family history.  Social History   Socioeconomic History  . Marital status: Single    Spouse name: Not on file  . Number of children: Not on file  . Years of education: Not on file  . Highest education level: Not on file  Occupational History  . Not on file  Social Needs  . Financial resource strain: Not on file  . Food insecurity:    Worry: Patient refused    Inability: Patient refused  . Transportation needs:    Medical: Patient refused    Non-medical: Patient refused  Tobacco Use  . Smoking status: Current Every Day Smoker    Years: 2.00    Types: E-cigarettes    Last attempt to quit: 08/17/2015    Years since quitting: 2.0  . Smokeless tobacco: Never Used  Substance and Sexual Activity  . Alcohol use: Not  Currently    Frequency: Never  . Drug use: Not Currently    Comment: recovering Heroin Addict   . Sexual activity: Not on file  Lifestyle  . Physical activity:    Days per week: Patient refused    Minutes per session: Patient refused  . Stress: Not on file  Relationships  . Social connections:    Talks on phone: Patient refused    Gets together: Patient refused    Attends religious service: Patient refused    Active member of club or organization: Patient refused    Attends meetings of clubs or organizations: Patient refused    Relationship status: Patient refused  . Intimate partner violence:    Fear of current or ex partner: Patient refused    Emotionally abused: Patient refused    Physically abused: Patient refused    Forced sexual activity: Patient refused  Other Topics Concern  . Not on file  Social History Narrative  . Not on file    No Known Allergies  Outpatient Medications Prior to Visit  Medication Sig Dispense Refill  . Buprenorphine HCl-Naloxone HCl 8-2 MG FILM DISSOLVE 1 FILM UNDER THE TONGUE TWO TIMES A DAY  1  . citalopram (CELEXA) 40 MG tablet TAKE 1 2 TABLET BY MOUTH TWO TIMES A DAY  1  . Etonogestrel (NEXPLANON [place]) Inject into the skin.    Marland Kitchen. gabapentin (NEURONTIN) 300  MG capsule Take 300 mg by mouth 3 (three) times daily.  1  . lidocaine (XYLOCAINE) 2 % solution 20 ml gargle and spit q 6 hours prn 100 mL 0   No facility-administered medications prior to visit.     Review of Systems  Constitutional: Positive for weight loss. Negative for chills, fever and malaise/fatigue.  HENT: Negative for ear discharge, ear pain and sore throat.   Eyes: Negative for blurred vision.  Respiratory: Negative for cough, sputum production, shortness of breath and wheezing.   Cardiovascular: Negative for chest pain, palpitations and leg swelling.  Gastrointestinal: Positive for abdominal pain. Negative for anorexia, blood in stool, constipation, diarrhea, flatus,  heartburn, hematochezia, melena, nausea and vomiting.  Genitourinary: Negative for dysuria, frequency, hematuria and urgency.  Musculoskeletal: Negative for arthralgias, back pain, joint pain, myalgias and neck pain.  Skin: Negative for rash.  Neurological: Negative for dizziness, tingling, sensory change, focal weakness and headaches.  Endo/Heme/Allergies: Negative for environmental allergies and polydipsia. Does not bruise/bleed easily.  Psychiatric/Behavioral: Negative for depression and suicidal ideas. The patient is not nervous/anxious and does not have insomnia.      Objective  Vitals:   08/16/17 1449  BP: 112/76  Pulse: 64  Resp: 16  SpO2: 98%  Weight: 163 lb 12.8 oz (74.3 kg)  Height: 5\' 4"  (1.626 m)    Physical Exam  Constitutional: She is oriented to person, place, and time. She appears well-developed and well-nourished.  HENT:  Head: Normocephalic.  Right Ear: External ear normal.  Left Ear: External ear normal.  Mouth/Throat: Oropharynx is clear and moist.  Eyes: Pupils are equal, round, and reactive to light. Conjunctivae and EOM are normal. Lids are everted and swept, no foreign bodies found. Left eye exhibits no hordeolum. No foreign body present in the left eye. Right conjunctiva is not injected. Left conjunctiva is not injected. No scleral icterus.  Neck: Normal range of motion. Neck supple. No JVD present. No tracheal deviation present. No thyromegaly present.  Cardiovascular: Normal rate, regular rhythm, normal heart sounds and intact distal pulses. Exam reveals no gallop and no friction rub.  No murmur heard. Pulmonary/Chest: Effort normal and breath sounds normal. No respiratory distress. She has no wheezes. She has no rales.  Abdominal: Soft. Bowel sounds are normal. She exhibits no shifting dullness, no distension, no abdominal bruit, no pulsatile midline mass and no mass. There is no hepatosplenomegaly, splenomegaly or hepatomegaly. There is tenderness in the  right lower quadrant. There is no rigidity, no rebound, no guarding and no CVA tenderness.  Musculoskeletal: Normal range of motion. She exhibits no edema or tenderness.  Lymphadenopathy:    She has no cervical adenopathy.  Neurological: She is alert and oriented to person, place, and time. She has normal strength. She displays normal reflexes. No cranial nerve deficit.  Skin: Skin is warm. No rash noted.  Psychiatric: She has a normal mood and affect. Her mood appears not anxious. She does not exhibit a depressed mood.  Nursing note and vitals reviewed.     Assessment & Plan  Problem List Items Addressed This Visit    None    Visit Diagnoses    Establishing care with new doctor, encounter for    -  Primary   Right flank pain       Pain noted more than a month. Will schedule pelvic /abd ultrasound will check cbc and screen for chlamydia/gc.   Relevant Orders   CBC with Differential/Platelet   Ambulatory referral to Obstetrics /  Gynecology   Urinalysis, Routine w reflex microscopic   GC/Chlamydia Probe Amp([facility])   HIV antibody (with reflex)   US Abdomen Complete   US Pelvis Complete   Hx of abnormal cervical Pap smear       Last pap 2006 with history of abnormal pap. will refer to [facility] gyn for pap.   Relevant Orders   Ambulatory referral to Obstetrics / Gynecology   US Abdomen Complete   US Pelvis Complete      No orders of the defined types were placed in this encounter.     Dr. Hay[facility]  08/16/17

## 2017-08-18 LAB — MISC LABCORP TEST (SEND OUT): LABCORP TEST CODE: 183194

## 2017-08-19 ENCOUNTER — Other Ambulatory Visit: Payer: Self-pay

## 2017-08-19 DIAGNOSIS — R109 Unspecified abdominal pain: Secondary | ICD-10-CM

## 2017-08-19 DIAGNOSIS — Z8742 Personal history of other diseases of the female genital tract: Secondary | ICD-10-CM

## 2017-08-19 LAB — HIV ANTIBODY (ROUTINE TESTING W REFLEX): HIV Screen 4th Generation wRfx: NONREACTIVE

## 2017-08-27 ENCOUNTER — Ambulatory Visit
Admission: RE | Admit: 2017-08-27 | Discharge: 2017-08-27 | Disposition: A | Discharge status: Home / Self Care | Payer: Managed Care, Other (non HMO) | Source: Ambulatory Visit | Attending: Family Medicine | Admitting: Family Medicine

## 2017-08-27 DIAGNOSIS — Z87898 Personal history of other specified conditions: Secondary | ICD-10-CM | POA: Diagnosis not present

## 2017-08-27 DIAGNOSIS — R109 Unspecified abdominal pain: Secondary | ICD-10-CM | POA: Insufficient documentation

## 2017-08-27 DIAGNOSIS — Z8742 Personal history of other diseases of the female genital tract: Secondary | ICD-10-CM

## 2017-08-28 ENCOUNTER — Other Ambulatory Visit: Payer: Self-pay

## 2017-08-28 DIAGNOSIS — Z8742 Personal history of other diseases of the female genital tract: Secondary | ICD-10-CM

## 2017-11-14 ENCOUNTER — Ambulatory Visit: Payer: Managed Care, Other (non HMO) | Admitting: Family Medicine

## 2017-11-14 ENCOUNTER — Encounter: Payer: Self-pay | Admitting: Family Medicine

## 2017-11-14 VITALS — BP 100/60 | HR 76 | Ht 64.0 in | Wt 154.0 lb

## 2017-11-14 DIAGNOSIS — W540XXA Bitten by dog, initial encounter: Secondary | ICD-10-CM

## 2017-11-14 DIAGNOSIS — S71141D Puncture wound with foreign body, right thigh, subsequent encounter: Secondary | ICD-10-CM | POA: Diagnosis not present

## 2017-11-14 DIAGNOSIS — Z23 Encounter for immunization: Secondary | ICD-10-CM

## 2017-11-14 DIAGNOSIS — S7011XD Contusion of right thigh, subsequent encounter: Secondary | ICD-10-CM

## 2017-11-14 MED ORDER — MUPIROCIN 2 % EX OINT: 1.0000 "application " | TOPICAL_OINTMENT | Freq: Two times a day (BID) | CUTANEOUS | 0 refills | Status: DC

## 2017-11-14 MED ORDER — AMOXICILLIN-POT CLAVULANATE 875-125 MG PO TABS: 1.0000 | ORAL_TABLET | Freq: Two times a day (BID) | ORAL | 0 refills | Status: DC

## 2017-11-14 MED ORDER — IBUPROFEN 800 MG PO TABS: 800.0000 mg | ORAL_TABLET | Freq: Three times a day (TID) | ORAL | 0 refills | Status: DC | PRN

## 2017-11-14 NOTE — Progress Notes (Signed)
Name: Kimberly Hayes   MRN: [id number]    DOB: [birth year]   Date:11/14/2017       Progress Note  Subjective  Chief Complaint  Chief Complaint  Patient presents with  . Animal Bite    can't tell if it was a bite or a claw mork, but has 2 puncture spots and a bruise around area. Occurred Friday the 6th    Animal Bite   The incident occurred more than 2 days ago. The incident occurred at another residence. There is an injury to the right thigh. The pain is moderate. Pertinent negatives include no chest pain, no abdominal pain, no nausea, no headaches, no inability to bear weight, no neck pain, no focal weakness, no tingling and no cough. She has received no recent medical care.    No problem-specific Assessment & Plan notes found for this encounter.   Past Medical History:  Diagnosis Date  . Anxiety   . Depression   . Opiate addiction (HCC) 10/04/2014   Heroin and Opiate     History reviewed. No pertinent surgical history.  History reviewed. No pertinent family history.  Social History   Socioeconomic History  . Marital status: Single    Spouse name: Not on file  . Number of children: Not on file  . Years of education: Not on file  . Highest education level: Not on file  Occupational History  . Not on file  Social Needs  . Financial resource strain: Not on file  . Food insecurity:    Worry: Patient refused    Inability: Patient refused  . Transportation needs:    Medical: Patient refused    Non-medical: Patient refused  Tobacco Use  . Smoking status: Current Every Day Smoker    Years: 2.00    Types: E-cigarettes    Last attempt to quit: 08/17/2015    Years since quitting: 2.2  . Smokeless tobacco: Never Used  . Tobacco comment: smoking cessation discussed  Substance and Sexual Activity  . Alcohol use: Not Currently    Frequency: Never  . Drug use: Not Currently    Comment: recovering Heroin Addict   . Sexual activity: Not on file  Lifestyle  . Physical  activity:    Days per week: Patient refused    Minutes per session: Patient refused  . Stress: Not on file  Relationships  . Social connections:    Talks on phone: Patient refused    Gets together: Patient refused    Attends religious service: Patient refused    Active member of club or organization: Patient refused    Attends meetings of clubs or organizations: Patient refused    Relationship status: Patient refused  . Intimate partner violence:    Fear of current or ex partner: Patient refused    Emotionally abused: Patient refused    Physically abused: Patient refused    Forced sexual activity: Patient refused  Other Topics Concern  . Not on file  Social History Narrative  . Not on file    No Known Allergies  Outpatient Medications Prior to Visit  Medication Sig Dispense Refill  . Buprenorphine HCl-Naloxone HCl 8-2 MG FILM DISSOLVE 1 FILM UNDER THE TONGUE TWO TIMES A DAY  1  . citalopram (CELEXA) 40 MG tablet TAKE 1 2 TABLET BY MOUTH TWO TIMES A DAY  1  . Etonogestrel (NEXPLANON [place]) Inject into the skin.    Marland Kitchen gabapentin (NEURONTIN) 300 MG capsule Take 300 mg by mouth 3 (three) times  daily.  1   No facility-administered medications prior to visit.     Review of Systems  Constitutional: Negative for chills, fever, malaise/fatigue and weight loss.  HENT: Negative for ear discharge, ear pain and sore throat.   Eyes: Negative for blurred vision.  Respiratory: Negative for cough, sputum production, shortness of breath and wheezing.   Cardiovascular: Negative for chest pain, palpitations and leg swelling.  Gastrointestinal: Negative for abdominal pain, blood in stool, constipation, diarrhea, heartburn, melena and nausea.  Genitourinary: Negative for dysuria, frequency, hematuria and urgency.  Musculoskeletal: Negative for back pain, joint pain, myalgias and neck pain.  Skin: Negative for rash.  Neurological: Negative for dizziness, tingling, sensory change, focal weakness and  headaches.  Endo/Heme/Allergies: Negative for environmental allergies and polydipsia. Does not bruise/bleed easily.  Psychiatric/Behavioral: Negative for depression and suicidal ideas. The patient is not nervous/anxious and does not have insomnia.      Objective  Vitals:   11/14/17 1522  BP: 100/60  Pulse: 76  Weight: 154 lb (69.9 kg)  Height: 5\' 4"  (1.626 m)    Physical Exam  Constitutional: She is oriented to person, place, and time. She appears well-developed and well-nourished.  HENT:  Head: Normocephalic.  Right Ear: External ear normal.  Left Ear: External ear normal.  Mouth/Throat: Oropharynx is clear and moist.  Eyes: Pupils are equal, round, and reactive to light. Conjunctivae and EOM are normal. Lids are everted and swept, no foreign bodies found. Left eye exhibits no hordeolum. No foreign body present in the left eye. Right conjunctiva is not injected. Left conjunctiva is not injected. No scleral icterus.  Neck: Normal range of motion. Neck supple. No JVD present. No tracheal deviation present. No thyromegaly present.  Cardiovascular: Normal rate, regular rhythm, normal heart sounds and intact distal pulses. Exam reveals no gallop and no friction rub.  No murmur heard. Pulmonary/Chest: Effort normal and breath sounds normal. No respiratory distress. She has no wheezes. She has no rales.  Abdominal: Soft. Bowel sounds are normal. She exhibits no mass. There is no hepatosplenomegaly. There is no tenderness. There is no rebound and no guarding.  Musculoskeletal: Normal range of motion. She exhibits tenderness. She exhibits no edema.  Lymphadenopathy:    She has no cervical adenopathy.  Neurological: She is alert and oriented to person, place, and time. She has normal strength. She displays normal reflexes. No cranial nerve deficit.  Skin: Skin is warm. No rash noted.     2x4 inch bruise 2 punctre wounds separated by 1.5 inches surrounded by pale circumference.    Psychiatric: She has a normal mood and affect. Her mood appears not anxious. She does not exhibit a depressed mood.  Nursing note and vitals reviewed.     Assessment & Plan  Problem List Items Addressed This Visit    None    Visit Diagnoses    Dog bite, initial encounter    -  Primary   started augmentin/ ibuprofen/Bactriban ointment. Dressed bite   Relevant Medications   ibuprofen (ADVIL,MOTRIN) 800 MG tablet   amoxicillin-clavulanate (AUGMENTIN) 875-125 MG tablet   mupirocin ointment (BACTROBAN) 2 %   Other Relevant Orders   Tdap vaccine greater than or equal to 7yo IM (Completed)   Need for diphtheria-tetanus-pertussis (Tdap) vaccine       TDAP administered due to bite   Relevant Orders   Tdap vaccine greater than or equal to 7yo IM (Completed)      Meds ordered this encounter  Medications  . ibuprofen (  ADVIL,MOTRIN) 800 MG tablet    Sig: Take 1 tablet (800 mg total) by mouth every 8 (eight) hours as needed.    Dispense:  30 tablet    Refill:  0  . amoxicillin-clavulanate (AUGMENTIN) 875-125 MG tablet    Sig: Take 1 tablet by mouth 2 (two) times daily.    Dispense:  19 tablet    Refill:  0  . mupirocin ointment (BACTROBAN) 2 %    Sig: Apply 1 application topically 2 (two) times daily.    Dispense:  22 g    Refill:  0      Dr. Hayden Rasmusseneanna Emiliano [facility]  11/14/17

## 2017-11-28 ENCOUNTER — Ambulatory Visit: Payer: Managed Care, Other (non HMO) | Admitting: Family Medicine

## 2017-11-28 ENCOUNTER — Encounter: Payer: Self-pay | Admitting: Family Medicine

## 2017-11-28 VITALS — BP 120/62 | HR 72 | Ht 64.0 in | Wt 148.0 lb

## 2017-11-28 DIAGNOSIS — W540XXD Bitten by dog, subsequent encounter: Secondary | ICD-10-CM | POA: Diagnosis not present

## 2017-11-28 DIAGNOSIS — S71151D Open bite, right thigh, subsequent encounter: Secondary | ICD-10-CM | POA: Diagnosis not present

## 2017-11-28 DIAGNOSIS — S7011XD Contusion of right thigh, subsequent encounter: Secondary | ICD-10-CM

## 2017-11-28 NOTE — Progress Notes (Signed)
Date:  11/28/2017   Name:  Kimberly Hayes   DOB:  08-28-1980   MRN:  [id number]   Chief Complaint: Follow-up (dog bite- still taking antibiotics, place is still tender/ feels like knots under skin) Followup puncture wounds and surrounding contusion from dog bite. No fever nor chills. Localized soreness/lessening and bruising    Review of Systems  Constitutional: Negative.  Negative for chills, fatigue, fever and unexpected weight change.  HENT: Negative for congestion, ear discharge, ear pain, rhinorrhea, sinus pressure, sneezing and sore throat.   Eyes: Negative for photophobia, pain, discharge, redness and itching.  Respiratory: Negative for cough, shortness of breath, wheezing and stridor.   Gastrointestinal: Negative for abdominal pain, blood in stool, constipation, diarrhea, nausea and vomiting.  Endocrine: Negative for cold intolerance, heat intolerance, polydipsia, polyphagia and polyuria.  Genitourinary: Negative for dysuria, flank pain, frequency, hematuria, menstrual problem, pelvic pain, urgency, vaginal bleeding and vaginal discharge.  Musculoskeletal: Negative for arthralgias, back pain and myalgias.  Skin: Negative for rash.  Allergic/Immunologic: Negative for environmental allergies and food allergies.  Neurological: Negative for dizziness, weakness, light-headedness, numbness and headaches.  Hematological: Negative for adenopathy. Does not bruise/bleed easily.  Psychiatric/Behavioral: Negative for dysphoric mood. The patient is not nervous/anxious.     There are no active problems to display for this patient.   No Known Allergies  No past surgical history on file.  Social History   Tobacco Use  . Smoking status: Current Every Day Smoker    Years: 2.00    Types: E-cigarettes    Last attempt to quit: 08/17/2015    Years since quitting: 2.2  . Smokeless tobacco: Never Used  . Tobacco comment: smoking cessation discussed  Substance Use Topics  . Alcohol  use: Not Currently    Frequency: Never  . Drug use: Not Currently    Comment: recovering Heroin Addict      Medication list has been reviewed and updated.  Current Meds  Medication Sig  . amoxicillin-clavulanate (AUGMENTIN) 875-125 MG tablet Take 1 tablet by mouth 2 (two) times daily.  . Buprenorphine HCl-Naloxone HCl 8-2 MG FILM DISSOLVE 1 FILM UNDER THE TONGUE TWO TIMES A DAY  . citalopram (CELEXA) 40 MG tablet Take 40 mg by mouth daily.   . Etonogestrel (NEXPLANON ) Inject into the skin.  Marland Kitchen gabapentin (NEURONTIN) 300 MG capsule Take 300 mg by mouth 3 (three) times daily.  Marland Kitchen ibuprofen (ADVIL,MOTRIN) 800 MG tablet Take 1 tablet (800 mg total) by mouth every 8 (eight) hours as needed.  . mupirocin ointment (BACTROBAN) 2 % Apply 1 application topically 2 (two) times daily.    No flowsheet data found.  Physical Exam  Constitutional: She is oriented to person, place, and time. She appears well-developed and well-nourished.  HENT:  Head: Normocephalic.  Right Ear: External ear normal.  Left Ear: External ear normal.  Mouth/Throat: Oropharynx is clear and moist.  Eyes: Pupils are equal, round, and reactive to light. Conjunctivae and EOM are normal. Lids are everted and swept, no foreign bodies found. Left eye exhibits no hordeolum. No foreign body present in the left eye. Right conjunctiva is not injected. Left conjunctiva is not injected. No scleral icterus.  Neck: Normal range of motion. Neck supple. No JVD present. No tracheal deviation present. No thyromegaly present.  Cardiovascular: Normal rate, regular rhythm, normal heart sounds and intact distal pulses. Exam reveals no gallop and no friction rub.  No murmur heard. Pulmonary/Chest: Effort normal and breath sounds normal. No respiratory  distress. She has no wheezes. She has no rales.  Abdominal: Soft. Bowel sounds are normal. She exhibits no mass. There is no hepatosplenomegaly. There is no tenderness. There is no rebound and no  guarding.  Musculoskeletal: Normal range of motion. She exhibits no edema or tenderness.  Lymphadenopathy:    She has no cervical adenopathy.  Neurological: She is alert and oriented to person, place, and time. She has normal strength. She displays normal reflexes. No cranial nerve deficit.  Skin: Skin is warm. Bruising noted. No laceration and no rash noted. No erythema.  No residual puncture  Psychiatric: She has a normal mood and affect. Her mood appears not anxious. She does not exhibit a depressed mood.  Nursing note and vitals reviewed.   BP 120/62   Pulse 72   Ht 5\' 4"  (1.626 m)   Wt 148 lb (67.1 kg)   BMI 25.40 kg/m   Assessment and Plan: 1. Dog bite of right thigh, subsequent encounter Recheck bite- continue antibiotic/ bruising noted  2. Contusion of right thigh, subsequent encounter Continue antibiotic/ bruising noted   Dr. Elizabeth Sauer [facility]  11/28/2017

## 2018-05-14 ENCOUNTER — Encounter: Payer: Self-pay | Admitting: Family Medicine

## 2018-05-14 ENCOUNTER — Ambulatory Visit: Payer: 59 | Admitting: Family Medicine

## 2018-05-14 ENCOUNTER — Other Ambulatory Visit: Payer: Self-pay

## 2018-05-14 VITALS — BP 90/60 | HR 60 | Ht 64.0 in | Wt 140.0 lb

## 2018-05-14 DIAGNOSIS — N939 Abnormal uterine and vaginal bleeding, unspecified: Secondary | ICD-10-CM

## 2018-05-14 DIAGNOSIS — F321 Major depressive disorder, single episode, moderate: Secondary | ICD-10-CM

## 2018-05-14 DIAGNOSIS — R634 Abnormal weight loss: Secondary | ICD-10-CM

## 2018-05-14 DIAGNOSIS — F17210 Nicotine dependence, cigarettes, uncomplicated: Secondary | ICD-10-CM | POA: Diagnosis not present

## 2018-05-14 NOTE — Progress Notes (Signed)
Date:  05/14/2018   Name:  Kimberly Hayes   DOB:  [birth year]   MRN:  [id number]   Chief Complaint: Hypotension (having low b/p readings with psych) and Weight Loss (Glucose and thyroid check/ sees psych PHQ9=21)  Thyroid Problem  Presents for initial visit. Symptoms include anxiety, cold intolerance, constipation, depressed mood, diaphoresis, fatigue, hair loss, menstrual problem, palpitations and weight loss. Patient reports no diarrhea, dry skin, heat intolerance, hoarse voice, leg swelling, nail problem, tremors, visual change or weight gain. Past treatments include nothing. There is no history of atrial fibrillation, dementia, diabetes, Graves' ophthalmopathy, heart failure, hyperlipidemia, neuropathy, obesity or osteopenia.  Vaginal Bleeding  The patient's primary symptoms include vaginal bleeding. The patient's pertinent negatives include no pelvic pain or vaginal discharge. This is a new problem. The current episode started more than 1 month ago (2 months). The problem has been waxing and waning. The patient is experiencing no pain. Associated symptoms include constipation. Pertinent negatives include no abdominal pain, back pain, chills, diarrhea, dysuria, fever, flank pain, frequency, headaches, hematuria, nausea, rash, sore throat, urgency or vomiting. Associated symptoms comments: Has Nexplanton.    Review of Systems  Constitutional: Positive for diaphoresis, fatigue and weight loss. Negative for chills, fever, unexpected weight change and weight gain.  HENT: Negative for congestion, ear discharge, ear pain, hoarse voice, rhinorrhea, sinus pressure, sneezing and sore throat.   Eyes: Negative for photophobia, pain, discharge, redness and itching.  Respiratory: Negative for cough, shortness of breath, wheezing and stridor.   Cardiovascular: Positive for palpitations.  Gastrointestinal: Positive for constipation. Negative for abdominal pain, blood in stool, diarrhea, nausea and  vomiting.  Endocrine: Positive for cold intolerance. Negative for heat intolerance, polydipsia, polyphagia and polyuria.  Genitourinary: Positive for menstrual problem and vaginal bleeding. Negative for dysuria, flank pain, frequency, hematuria, pelvic pain, urgency and vaginal discharge.  Musculoskeletal: Negative for arthralgias, back pain and myalgias.  Skin: Negative for rash.  Allergic/Immunologic: Negative for environmental allergies and food allergies.  Neurological: Negative for dizziness, tremors, weakness, light-headedness, numbness and headaches.  Hematological: Negative for adenopathy. Does not bruise/bleed easily.  Psychiatric/Behavioral: Negative for dysphoric mood. The patient is nervous/anxious.     There are no active problems to display for this patient.   No Known Allergies  No past surgical history on file.  Social History   Tobacco Use  . Smoking status: Current Every Day Smoker    Years: 2.00    Types: E-cigarettes    Last attempt to quit: 08/17/2015    Years since quitting: 2.7  . Smokeless tobacco: Never Used  . Tobacco comment: smoking cessation discussed  Substance Use Topics  . Alcohol use: Not Currently    Frequency: Never  . Drug use: Not Currently    Comment: recovering Heroin Addict      Medication list has been reviewed and updated.  Current Meds  Medication Sig  . Buprenorphine HCl-Naloxone HCl 8-2 MG FILM DISSOLVE 1 FILM UNDER THE TONGUE TWO TIMES A DAY  . citalopram (CELEXA) 40 MG tablet Take 40 mg by mouth daily.   . Etonogestrel (NEXPLANON [place]) Inject into the skin.  Marland Kitchen gabapentin (NEURONTIN) 300 MG capsule Take 300 mg by mouth 3 (three) times daily.  Marland Kitchen ibuprofen (ADVIL,MOTRIN) 800 MG tablet Take 1 tablet (800 mg total) by mouth every 8 (eight) hours as needed.    PHQ 2/9 Scores 05/14/2018  PHQ - 2 Score 4  PHQ- 9 Score 21    Physical Exam Vitals signs  and nursing note reviewed.  Constitutional:      General: She is not in acute  distress.    Appearance: She is not diaphoretic.  HENT:     Head: Normocephalic and atraumatic.     Right Ear: External ear normal.     Left Ear: External ear normal.     Nose: Nose normal.  Eyes:     General:        Right eye: No discharge.        Left eye: No discharge.     Conjunctiva/sclera: Conjunctivae normal.     Pupils: Pupils are equal, round, and reactive to light.  Neck:     Musculoskeletal: Normal range of motion and neck supple.     Thyroid: No thyromegaly.     Vascular: No JVD.  Cardiovascular:     Rate and Rhythm: Normal rate and regular rhythm.     Heart sounds: Normal heart sounds, S1 normal and S2 normal. No murmur. No systolic murmur. No diastolic murmur. No friction rub. No gallop. No S3 or S4 sounds.   Pulmonary:     Effort: Pulmonary effort is normal.     Breath sounds: Normal breath sounds. No decreased air movement. No decreased breath sounds, wheezing, rhonchi or rales.  Abdominal:     General: Bowel sounds are normal.     Palpations: Abdomen is soft. There is no hepatomegaly or mass.     Tenderness: There is no abdominal tenderness. There is no guarding.  Musculoskeletal: Normal range of motion.     Right lower leg: No edema.     Left lower leg: No edema.  Lymphadenopathy:     Cervical: No cervical adenopathy.  Skin:    General: Skin is warm and dry.  Neurological:     Mental Status: She is alert.     Deep Tendon Reflexes: Reflexes are normal and symmetric.     Wt Readings from Last 3 Encounters:  05/14/18 140 lb (63.5 kg)  11/28/17 148 lb (67.1 kg)  11/14/17 154 lb (69.9 kg)    BP 90/60   Pulse 60   Ht 5\' 4"  (1.626 m)   Wt 140 lb (63.5 kg)   BMI 24.03 kg/m   Assessment and Plan: 1. Weight loss, unintentional Patient is being seen as requested by her psychiatrist because of a unintentional weight loss.  Even though he is treating her for depression we will rule out other causes of weight loss.  Will include CBC renal function panel  hepatic function and thyroid function.  Patient has been having some vaginal bleeding as noted below so we will refer to GYN evaluate for cervical cancer.  You have labs noted that previous HIV was negative - CBC with Differential/Platelet - Renal Function Panel - Hepatic Function Panel (6) - Thyroid Panel With TSH - Ambulatory referral to Obstetrics / Gynecology  2. Vagina bleeding And is having vaginal bleeding presently with Perception maintenance.  Referral will be made to GYN for evaluation of this with Pap which is due. - Ambulatory referral to Obstetrics / Gynecology  3. Current moderate episode of major depressive disorder, unspecified whether recurrent (HCC) Has current issue major depression with a PHQ 91 for which she is followed by psychiatry on multiple medications.  4. Cigarette nicotine dependence without complication Patient has been advised of the health risks of smoking and counseled concerning cessation of tobacco products. I spent over 3 minutes for discussion and to answer questions.

## 2018-05-15 LAB — RENAL FUNCTION PANEL
Albumin: 4.4 g/dL (ref 3.8–4.8)
BUN / CREAT RATIO: 13 (ref 9–23)
BUN: 11 mg/dL (ref 6–20)
CALCIUM: 9.5 mg/dL (ref 8.7–10.2)
CO2: 23 mmol/L (ref 20–29)
CREATININE: 0.87 mg/dL (ref 0.57–1.00)
Chloride: 102 mmol/L (ref 96–106)
GFR calc Af Amer: 100 mL/min/{1.73_m2} (ref >59)
GFR calc non Af Amer: 87 mL/min/{1.73_m2} (ref >59)
Glucose: 88 mg/dL (ref 65–99)
Phosphorus: 3.2 mg/dL (ref 3.0–4.3)
Potassium: 4.9 mmol/L (ref 3.5–5.2)
Sodium: 138 mmol/L (ref 134–144)

## 2018-05-15 LAB — CBC WITH DIFFERENTIAL/PLATELET
BASOS: 1 %
Basophils Absolute: 0 10*3/uL (ref 0.0–0.2)
EOS (ABSOLUTE): 0.1 10*3/uL (ref 0.0–0.4)
Eos: 1 %
HEMATOCRIT: 40.3 % (ref 34.0–46.6)
Hemoglobin: 13.2 g/dL (ref 11.1–15.9)
IMMATURE GRANULOCYTES: 0 %
Immature Grans (Abs): 0 10*3/uL (ref 0.0–0.1)
LYMPHS ABS: 1.3 10*3/uL (ref 0.7–3.1)
Lymphs: 29 %
MCH: 29.9 pg (ref 26.6–33.0)
MCHC: 32.8 g/dL (ref 31.5–35.7)
MCV: 91 fL (ref 79–97)
MONOS ABS: 0.3 10*3/uL (ref 0.1–0.9)
Monocytes: 6 %
NEUTROS PCT: 63 %
Neutrophils Absolute: 2.9 10*3/uL (ref 1.4–7.0)
PLATELETS: 225 10*3/uL (ref 150–450)
RBC: 4.42 x10E6/uL (ref 3.77–5.28)
RDW: 12.3 % (ref 11.7–15.4)
WBC: 4.6 10*3/uL (ref 3.4–10.8)

## 2018-05-15 LAB — HEPATIC FUNCTION PANEL (6)
ALT: 11 IU/L (ref 0–32)
AST: 12 IU/L (ref 0–40)
Alkaline Phosphatase: 56 IU/L (ref 39–117)
BILIRUBIN TOTAL: 0.4 mg/dL (ref 0.0–1.2)
Bilirubin, Direct: 0.09 mg/dL (ref 0.00–0.40)

## 2018-05-15 LAB — THYROID PANEL WITH TSH
FREE THYROXINE INDEX: 2.4 (ref 1.2–4.9)
T3 UPTAKE RATIO: 27 % (ref 24–39)
T4, Total: 8.8 ug/dL (ref 4.5–12.0)
TSH: 1.17 u[IU]/mL (ref 0.450–4.500)

## 2018-05-16 ENCOUNTER — Telehealth: Payer: Self-pay | Admitting: Obstetrics & Gynecology

## 2018-05-16 NOTE — Telephone Encounter (Signed)
[facility] referring for Weight loss, unintentional, and vaginal bleeding. ([place]) Called and left voicemail for patient to call back to be schedule

## 2018-05-22 ENCOUNTER — Encounter: Payer: Self-pay | Admitting: Obstetrics and Gynecology

## 2018-06-03 NOTE — Telephone Encounter (Signed)
Attempt to reach patient phone has a busy signal. Unable to leave message

## 2018-06-16 ENCOUNTER — Ambulatory Visit: Payer: 59 | Admitting: Family Medicine

## 2018-06-18 ENCOUNTER — Other Ambulatory Visit: Payer: Self-pay

## 2018-06-18 ENCOUNTER — Ambulatory Visit: Payer: 59 | Admitting: Family Medicine

## 2018-06-18 ENCOUNTER — Encounter: Payer: Self-pay | Admitting: Family Medicine

## 2018-06-18 VITALS — BP 112/64 | HR 52 | Ht 64.0 in | Wt 145.0 lb

## 2018-06-18 DIAGNOSIS — R634 Abnormal weight loss: Secondary | ICD-10-CM

## 2018-06-18 NOTE — Progress Notes (Signed)
Date:  06/18/2018   Name:  Kimberly Hayes   DOB:  [birth year]   MRN:  [id number]   Chief Complaint: Follow-up (has an appt with Dr Jean Ros[place] on April 22nd for follow up vaginal bleeding)  Patient is a 38 year old female who presents for a unintentional weight loss exam. The patient reports the following problems: vaginal bleeding. Health maintenance has been reviewed up to date.   Review of Systems  Constitutional: Negative for chills and fever.  HENT: Negative for drooling, ear discharge, ear pain and sore throat.   Respiratory: Negative for cough, shortness of breath and wheezing.   Cardiovascular: Negative for chest pain, palpitations and leg swelling.  Gastrointestinal: Negative for abdominal pain, blood in stool, constipation, diarrhea and nausea.  Endocrine: Negative for polydipsia.  Genitourinary: Negative for dysuria, frequency, hematuria and urgency.  Musculoskeletal: Negative for back pain, myalgias and neck pain.  Skin: Negative for rash.  Allergic/Immunologic: Negative for environmental allergies.  Neurological: Negative for dizziness and headaches.  Hematological: Does not bruise/bleed easily.  Psychiatric/Behavioral: Negative for suicidal ideas. The patient is not nervous/anxious.     There are no active problems to display for this patient.   No Known Allergies  No past surgical history on file.  Social History   Tobacco Use  . Smoking status: Current Every Day Smoker    Years: 2.00    Types: E-cigarettes    Last attempt to quit: 08/17/2015    Years since quitting: 2.8  . Smokeless tobacco: Never Used  . Tobacco comment: smoking cessation discussed  Substance Use Topics  . Alcohol use: Not Currently    Frequency: Never  . Drug use: Not Currently    Comment: recovering Heroin Addict      Medication list has been reviewed and updated.  Current Meds  Medication Sig  . Buprenorphine HCl-Naloxone HCl 8-2 MG FILM DISSOLVE 1 FILM UNDER THE  TONGUE TWO TIMES A DAY  . desvenlafaxine (PRISTIQ) 50 MG 24 hr tablet Take 50 mg by mouth daily. [facility]  . Etonogestrel [facility]) Inject into the skin.  Marland Kitchen gabapentin (NEURONTIN) 300 MG capsule Take 300 mg by mouth 3 (three) times daily.    PHQ 2/9 Scores 06/18/2018 05/14/2018  PHQ - 2 Score 3 4  PHQ- 9 Score 14 21    BP Readings from Last 3 Encounters:  06/18/18 112/64  05/14/18 90/60  11/28/17 120/62    Physical Exam Vitals signs reviewed.  Constitutional:      Appearance: She is well-developed.  HENT:     Head: Normocephalic.     Right Ear: Tympanic membrane, ear canal and external ear normal.     Left Ear: Tympanic membrane, ear canal and external ear normal.  Eyes:     General: Lids are everted, no foreign bodies appreciated. No visual field deficit or scleral icterus.       Left eye: No foreign body or hordeolum.     Conjunctiva/sclera: Conjunctivae normal.     Right eye: Right conjunctiva is not injected.     Left eye: Left conjunctiva is not injected.     Pupils: Pupils are equal, round, and reactive to light.  Neck:     Musculoskeletal: Normal range of motion and neck supple.     Thyroid: No thyromegaly.     Vascular: No JVD.     Trachea: No tracheal deviation.  Cardiovascular:     Rate and Rhythm: Normal rate and regular rhythm.  Heart sounds: Normal heart sounds. No murmur. No friction rub. No gallop.   Pulmonary:     Effort: Pulmonary effort is normal. No respiratory distress.     Breath sounds: Normal breath sounds. No wheezing or rales.  Abdominal:     General: Bowel sounds are normal.     Palpations: Abdomen is soft. There is no hepatomegaly or mass.     Tenderness: There is no abdominal tenderness. There is no guarding or rebound.  Musculoskeletal: Normal range of motion.        General: No tenderness.  Lymphadenopathy:     Cervical: No cervical adenopathy.  Skin:    General: Skin is warm.     Capillary Refill: Capillary refill  takes less than 2 seconds.     Coloration: Skin is not jaundiced or pale.     Findings: No rash.  Neurological:     Mental Status: She is alert and oriented to person, place, and time.     Cranial Nerves: No cranial nerve deficit.     Deep Tendon Reflexes: Reflexes normal.  Psychiatric:        Mood and Affect: Mood is not anxious or depressed.     Wt Readings from Last 3 Encounters:  06/18/18 145 lb (65.8 kg)  05/14/18 140 lb (63.5 kg)  11/28/17 148 lb (67.1 kg)    BP 112/64   Pulse (!) 52   Ht 5\' 4"  (1.626 m)   Wt 145 lb (65.8 kg)   SpO2 98%   BMI 24.89 kg/m   Assessment and Plan:  1. Unintentional weight loss Patient with follow-up of unintentional weight loss on last visit.  Upon review of flowsheets patient is noted to have increased her weight.  She is feeling better at this time.  She will continue with her GYN appointment because of sounds like dysfunctional uterine bleeding.

## 2018-06-25 ENCOUNTER — Ambulatory Visit (INDEPENDENT_AMBULATORY_CARE_PROVIDER_SITE_OTHER): Payer: 59 | Admitting: Obstetrics and Gynecology

## 2018-06-25 ENCOUNTER — Other Ambulatory Visit (HOSPITAL_COMMUNITY)
Admission: RE | Admit: 2018-06-25 | Discharge: 2018-06-25 | Disposition: A | Discharge status: Home / Self Care | Payer: 59 | Source: Ambulatory Visit | Attending: Obstetrics and Gynecology | Admitting: Obstetrics and Gynecology

## 2018-06-25 ENCOUNTER — Encounter: Payer: Self-pay | Admitting: Obstetrics and Gynecology

## 2018-06-25 ENCOUNTER — Other Ambulatory Visit: Payer: Self-pay

## 2018-06-25 VITALS — BP 122/74 | Ht 64.0 in | Wt 143.0 lb

## 2018-06-25 DIAGNOSIS — Z113 Encounter for screening for infections with a predominantly sexual mode of transmission: Secondary | ICD-10-CM | POA: Diagnosis present

## 2018-06-25 DIAGNOSIS — Z124 Encounter for screening for malignant neoplasm of cervix: Secondary | ICD-10-CM | POA: Diagnosis present

## 2018-06-25 DIAGNOSIS — R634 Abnormal weight loss: Secondary | ICD-10-CM

## 2018-06-25 DIAGNOSIS — N939 Abnormal uterine and vaginal bleeding, unspecified: Secondary | ICD-10-CM | POA: Insufficient documentation

## 2018-06-25 NOTE — Progress Notes (Signed)
Obstetrics & Gynecology Office Visit   Chief Complaint  Patient presents with  . Menstrual Problem  Referral from Dr. Otilio Miu, MD, from [facility]  History of Present Illness: 38 y.o. [phone] female who is seen in referral at the request of Dr. Otilio Miu from [facility] for unintentional weight loss and spotting with her Nexplanon.  According to the patient, she has had this Nexplanon for the past 2 years.  She states that [facility].  Prior to that, she had a Nexplanon for 3 years.  She states that with her first Nexplanon she had no bleeding until nearly the end of the lifespan of the device.  For the past 3 to 4 months she has been spotting about 1 time per month.  She states that the spotting will last anywhere from 1 to 7 days.  She states that she is not sexually active.  She states that the bleeding she is having currently is not bothersome.  She also notes an unintentional weight loss of about 65 pounds over the last 10 months.  She denies abdominal bloating.  She has had constipation for the past 3 years, so this is not new to her.  She does endorse early satiety.  She states that she has no appetite.  Of note she is taking Suboxone and has done so for the past 4 years due to the use of multiple illicit substances.   Past Medical History:  Diagnosis Date  . Anxiety   . Depression   . 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 NO PAST SURGERIES      Gynecologic History: No LMP recorded. Patient has had an implant.  Obstetric History: [id number], s/p SVD x 2. She has had multiple EABs and 2 SABs.   Family History  Problem Relation Age of Onset  . Autoimmune disease Mother   She states she is not entirely sure of her family's cancer history. She took a picture of our questionnaire and sent it to her mom to get more information.    Social History    Socioeconomic History  . Marital status: Single    Spouse name: Not on file  . Number of children: Not on file  . Years of education: Not on file  . Highest education level: Not on file  Occupational History  . Not on file  Social Needs  . Financial resource strain: Not on file  . Food insecurity:    Worry: Patient refused    Inability: Patient refused  . Transportation needs:    Medical: Patient refused    Non-medical: Patient refused  Tobacco Use  . Smoking status: Current Every Day Smoker    Years: 2.00    Types: E-cigarettes    Last attempt to quit: 08/17/2015    Years since quitting: 2.8  . Smokeless tobacco: Never Used  . Tobacco comment: smoking cessation discussed  Substance and Sexual Activity  . Alcohol use: Not Currently    Frequency: Never  . Drug use: Not Currently    Comment: recovering Heroin Addict   . Sexual activity: Not Currently    Birth control/protection: Implant  Lifestyle  . Physical activity:    Days per week: Patient refused    Minutes per session: Patient refused  . Stress: Not on file  Relationships  . Social connections:    Talks on phone:  Patient refused    Gets together: Patient refused    Attends religious service: Patient refused    Active member of club or organization: Patient refused    Attends meetings of clubs or organizations: Patient refused    Relationship status: Patient refused  . Intimate partner violence:    Fear of current or ex partner: Patient refused    Emotionally abused: Patient refused    Physically abused: Patient refused    Forced sexual activity: Patient refused  Other Topics Concern  . Not on file  Social History Narrative  . Not on file   Allergies: No Known Allergies  Prior to Admission medications   Medication Sig Start Date End Date Taking? Authorizing Provider  Buprenorphine HCl-Naloxone HCl 8-2 MG FILM DISSOLVE 1 FILM UNDER THE TONGUE TWO TIMES A DAY 06/17/17   [provider]   desvenlafaxine (PRISTIQ) 50 MG 24 hr tablet Take 50 mg by mouth daily. [facility] 05/19/18   [provider]  Etonogestrel (NEXPLANON [place]) Inject into the skin.    [provider]  gabapentin (NEURONTIN) 300 MG capsule Take 300 mg by mouth 3 (three) times daily. 05/24/17   [provider]  NARCAN 4 MG/0.1ML LIQD nasal spray kit  04/30/18   [provider]    Review of Systems  Constitutional: Positive for weight loss. Negative for chills, diaphoresis, fever and malaise/fatigue.  HENT: Negative.   Eyes: Negative.   Respiratory: Negative.   Cardiovascular: Negative.   Gastrointestinal: Positive for constipation. Negative for abdominal pain, blood in stool, diarrhea, heartburn, melena, nausea and vomiting.  Genitourinary: Negative.   Musculoskeletal: Negative.   Skin: Negative.   Neurological: Negative.   Psychiatric/Behavioral: Positive for depression. Negative for memory loss, substance abuse (former) and suicidal ideas. The patient is nervous/anxious. The patient does not have insomnia.      Physical Exam BP 122/74   Ht '5\' 4"'  (1.626 m)   Wt 143 lb (64.9 kg)   BMI 24.55 kg/m  No LMP recorded. Patient has had an implant. Physical Exam Constitutional:      General: She is not in acute distress.    Appearance: Normal appearance. She is well-developed.  Genitourinary:     Pelvic exam was performed with patient in the lithotomy position.     Vulva, inguinal canal, urethra, bladder, vagina, uterus, right adnexa and left adnexa normal.     No posterior fourchette tenderness, injury or lesion present.     No cervical friability, lesion, bleeding or polyp.  HENT:     Head: Normocephalic and atraumatic.  Eyes:     General: No scleral icterus.    Conjunctiva/sclera: Conjunctivae normal.  Neck:     Musculoskeletal: Normal range of motion and neck supple.  Cardiovascular:     Rate and Rhythm: Normal rate and regular rhythm.     Heart sounds:  No murmur. No friction rub. No gallop.   Pulmonary:     Effort: Pulmonary effort is normal. No respiratory distress.     Breath sounds: Normal breath sounds. No wheezing or rales.  Abdominal:     General: Bowel sounds are normal. There is no distension.     Palpations: Abdomen is soft. There is no mass.     Tenderness: There is no abdominal tenderness. There is no guarding or rebound.  Musculoskeletal: Normal range of motion.  Neurological:     General: No focal deficit present.     Mental Status: She is alert and oriented to  person, place, and time.     Cranial Nerves: No cranial nerve deficit.  Skin:    General: Skin is warm and dry.     Findings: No erythema.  Psychiatric:        Mood and Affect: Mood normal.        Behavior: Behavior normal.        Judgment: Judgment normal.    Female chaperone present for pelvic and breast  portions of the physical exam  Assessment: 38 y.o. No obstetric history on file. female here for  1. Abnormal uterine bleeding   2. Pap smear for cervical cancer screening   3. Screen for STD (sexually transmitted disease)   4. Unintentional weight loss      Plan: Problem List Items Addressed This Visit    None    Visit Diagnoses    Abnormal uterine bleeding    -  Primary   Relevant Orders   US PELVIS TRANSVANGINAL NON-OB (TV ONLY)   Cytology - PAP   Pap smear for cervical cancer screening       Relevant Orders   Cytology - PAP   Screen for STD (sexually transmitted disease)       Relevant Orders   Cytology - PAP   Unintentional weight loss       Relevant Orders   US PELVIS TRANSVANGINAL NON-OB (TV ONLY)   Cytology - PAP     Abnormal uterine bleeding: This is a known side effect of Nexplanon.  At this point, she has no complaints about the bleeding and is tolerating it well.  We reviewed that there may be some treatments for bothersome breakthrough bleeding with Nexplanon, should that occur.  At this time she does not wish intervention.   Unintentional weight loss: Etiology unknown.  Will assess for gynecologic etiology.  Pap smear obtained today.  Will obtain a pelvic ultrasound to assess her ovaries and uterus to ensure normal structure and no suspicion for ovarian cancer.  Gynecologic screening: She is well overdue for Pap smear.  This was performed today as a normal part of her work-up for her bleeding and her weight loss.  STD screening was added, as well.  She states she has not been sexually active for quite some time.  However, this could contribute to her spotting and possibly her weight loss.  She had a more or less normal-appearing cervix.  So, it is unlikely she has cervical cancer that would cause her unintentional weight loss and spotting.  However, we will follow-up on results.  30 minutes spent in face to face discussion with > 50% spent in counseling,management, and coordination of care of her abnormal uterine bleeding and unintentional weight loss.    Prentice Docker, MD 06/25/2018 5:26 PM     CC: Juline Patch, MD [id number] [place], [place] [address]

## 2018-07-01 LAB — CYTOLOGY - PAP
Chlamydia: NEGATIVE
Diagnosis: NEGATIVE
HPV: NOT DETECTED
Neisseria Gonorrhea: NEGATIVE

## 2018-07-21 ENCOUNTER — Ambulatory Visit: Payer: 59

## 2018-07-21 ENCOUNTER — Ambulatory Visit: Payer: 59 | Admitting: Obstetrics and Gynecology

## 2018-07-21 DIAGNOSIS — R634 Abnormal weight loss: Secondary | ICD-10-CM

## 2018-07-21 DIAGNOSIS — N939 Abnormal uterine and vaginal bleeding, unspecified: Principal | ICD-10-CM

## 2018-10-23 ENCOUNTER — Encounter: Payer: Self-pay | Admitting: Family Medicine

## 2018-10-23 ENCOUNTER — Other Ambulatory Visit: Payer: Self-pay

## 2018-10-23 ENCOUNTER — Ambulatory Visit (INDEPENDENT_AMBULATORY_CARE_PROVIDER_SITE_OTHER): Payer: 59 | Admitting: Family Medicine

## 2018-10-23 VITALS — BP 120/70 | HR 64 | Ht 64.0 in | Wt 139.0 lb

## 2018-10-23 DIAGNOSIS — L519 Erythema multiforme, unspecified: Secondary | ICD-10-CM

## 2018-10-23 DIAGNOSIS — L509 Urticaria, unspecified: Secondary | ICD-10-CM

## 2018-10-23 DIAGNOSIS — R58 Hemorrhage, not elsewhere classified: Secondary | ICD-10-CM | POA: Diagnosis not present

## 2018-10-23 MED ORDER — TRIAMCINOLONE ACETONIDE 0.1 % EX CREA: 1.0000 "application " | TOPICAL_CREAM | Freq: Two times a day (BID) | CUTANEOUS | 0 refills | Status: DC

## 2018-10-23 NOTE — Patient Instructions (Addendum)
Erythema Multiforme  Erythema multiforme is a skin rash that can also affect the lips and the inside of the mouth. Usually, the rash is mild and goes away on its own after 1-2 weeks. In some cases, the rash may come back (recur) after it has gone away. This condition most often affects young adults and children. What are the causes?  The cause of this condition is often unknown. It may be caused by the body's disease-fighting system (immune system) overreacting to certain substances, which are called triggers. Some common triggers include: ? Infections caused by:  The cold sore virus (herpes simplex virus, HSV).  Bacteria.  A fungus. ? Reactions to medicines. Less common triggers include menstruation, radiation therapy, and chemotherapy. What increases the risk? This condition is more likely to develop in:  People who are 20-40 years old.  Children.  Males.  People who inherit certain genes from their parents. What are the signs or symptoms? The rash from erythema multiforme:  Develops suddenly, a few days after exposure to a trigger.  May start as small, red, round or oval-shaped marks. Over 24-48 hours, the rash may change to bumps or raised welts that look like a target or "bull's eye." The bumps or welts can spread, and they may be up to about 1 inch (2.5 cm) in size.  Usually appears first on the back of the hands. It may spread to the arms, elbows, knees, palms, the tops and soles of the feet, the lips, and the lining of the mouth.  May cause itchiness and a burning feeling.  Goes away after 2-4 weeks. In some cases, it may come back. How is this diagnosed? This condition may be diagnosed based on:  Your symptoms and medical history.  A physical exam.  Blood tests.  Removal of a skin sample (skin biopsy) to be examined by a specialist (pathologist). How is this treated? Most episodes of erythema multiforme heal on their own, without medical treatment. In some  cases, a health care provider may prescribe medicine to help with itching. There are actions that you can take at home to help relieve rash symptoms, such as taking warm baths. If you have a severe case, you may be prescribed medicine to help prevent erythema multiforme from coming back. Your health care provider will recommend removing or avoiding your triggers, if possible.  If your trigger is an infection or other illness, you may be treated for that infection or illness.  If your trigger is a medicine that you are taking, you and your health care provider will discuss how you can avoid taking that medicine. Follow these instructions at home: Skin care  Avoid scratching your rash.  Apply heat to areas that are itchy or uncomfortable as needed. Use the heat source that your health care provider recommends, such as a moist heat pack or a heating pad. ? Place a towel between your skin and the heat source. ? Leave the heat on for 20-30 minutes. ? Remove the heat if your skin turns bright red. This is especially important if you are unable to feel pain, heat, or cold. You may have a greater risk of getting burned.  To help relieve itchiness: ? Take cool or warm baths. ? Avoid taking hot baths or showers. Hot water can make itchiness worse. ? Add dry oatmeal to your baths. You may take oatmeal baths 2-3 times a day, as needed. ? Make a paste with dry oatmeal and warm water, then put the   paste on itchy areas. Let the paste dry, remove it, and then apply moisturizer. You may do this 2-3 times a day, or as needed. General instructions  If possible, avoid your triggers. ? If your trigger is a herpes virus infection, use sunscreen lotion and lip balm that contains sunscreen. Use those products every day. Doing that helps to prevent sunlight-triggered outbreaks of herpes virus.  Take over-the-counter and prescription medicines only as told by your health care provider.  If you have sores in your  mouth or on your lips, try eating soft, bland foods until you feel better.  Keep all follow-up visits as told by your health care provider. This is important. Contact a health care provider if you:  Have a rash that comes back.  Have a fever. Get help right away if you:  Develop redness, swelling, or a burning feeling on your lips or in your mouth.  Develop blisters or open sores on your mouth, lips, vagina, penis, or anus.  Have eye pain or have changes in your vision.  Have redness around your eye.  Have fluid draining from your eye.  Develop blisters on your skin.  Have difficulty breathing.  Have difficulty swallowing, or you start drooling.  Have blood in your urine.  Have pain when you urinate. Summary  Erythema multiforme is a skin rash that can also affect the lips and the inside of the mouth.  The rash usually appears first on the back of the hands. It may spread to the arms, elbows, knees, palms, the tops and soles of the feet, the lips, and the lining of the mouth.  To help relieve itchiness, you can make a paste with dry oatmeal and warm water and put it on itchy areas.  Get help right away if you have any eye pain or changes in your vision. This information is not intended to replace advice given to you by your health care provider. Make sure you discuss any questions you have with your health care provider. Document Released: 02/19/2005 Document Revised: 02/01/2017 Document Reviewed: 12/25/2016 Elsevier Patient Education  [place] (urticaria) are itchy, red, swollen areas on the skin. Hives can appear on any part of the body. Hives often fade within 24 hours (acute hives). Sometimes, new hives appear after old ones fade and the cycle can continue for several days or weeks (chronic hives). Hives do not spread from person to person (are not contagious). Hives come from the body's reaction to something a person is allergic to (allergen),  something that causes irritation, or various other triggers. When a person is exposed to a trigger, his or her body releases a chemical (histamine) that causes redness, itching, and swelling. Hives can appear right after exposure to a trigger or hours later. What are the causes? This condition may be caused by:  Allergies to foods or ingredients.  Insect bites or stings.  Exposure to pollen or pets.  Contact with latex or chemicals.  Spending time in sunlight, heat, or cold (exposure).  Exercise.  Stress.  Certain medicines. You can also get hives from other medical conditions and treatments, such as:  Viruses, including the common cold.  Bacterial infections, such as urinary tract infections and strep throat.  Certain medicines.  Allergy shots.  Blood transfusions. Sometimes, the cause of this condition is not known (idiopathic hives). What increases the risk? You are more likely to develop this condition if you:  Are a woman.  Have food allergies,  especially to citrus fruits, milk, eggs, peanuts, tree nuts, or shellfish.  Are allergic to: ? Medicines. ? Latex. ? Insects. ? Animals. ? Pollen. What are the signs or symptoms? Common symptoms of this condition include raised, itchy, red or white bumps or patches on your skin. These areas may:  Become large and swollen (welts).  Change in shape and location, quickly and repeatedly.  Be separate hives or connect over a large area of skin.  Sting or become painful.  Turn white when pressed in the center (blanch). In severe cases, yourhands, feet, and face may also become swollen. This may occur if hives develop deeper in your skin. How is this diagnosed? This condition may be diagnosed by your symptoms, medical history, and physical exam.  Your skin, urine, or blood may be tested to find out what is causing your hives and to rule out other health issues.  Your health care provider may also remove a small  sample of skin from the affected area and examine it under a microscope (biopsy). How is this treated? Treatment for this condition depends on the cause and severity of your symptoms. Your health care provider may recommend using cool, wet cloths (cool compresses) or taking cool showers to relieve itching. Treatment may include:  Medicines that help: ? Relieve itching (antihistamines). ? Reduce swelling (corticosteroids). ? Treat infection (antibiotics).  An injectable medicine (omalizumab). Your health care provider may prescribe this if you have chronic idiopathic hives and you continue to have symptoms even after treatment with antihistamines. Severe cases may require an emergency injection of adrenaline (epinephrine) to prevent a life-threatening allergic reaction (anaphylaxis). Follow these instructions at home: Medicines  Take and apply over-the-counter and prescription medicines only as told by your health care provider.  If you were prescribed an antibiotic medicine, take it as told by your health care provider. Do not stop using the antibiotic even if you start to feel better. Skin care  Apply cool compresses to the affected areas.  Do not scratch or rub your skin. General instructions  Do not take hot showers or baths. This can make itching worse.  Do not wear tight-fitting clothing.  Use sunscreen and wear protective clothing when you are outside.  Avoid any substances that cause your hives. Keep a journal to help track what causes your hives. Write down: ? What medicines you take. ? What you eat and drink. ? What products you use on your skin.  Keep all follow-up visits as told by your health care provider. This is important. Contact a health care provider if:  Your symptoms are not controlled with medicine.  Your joints are painful or swollen. Get help right away if:  You have a fever.  You have pain in your abdomen.  Your tongue or lips are swollen.   Your eyelids are swollen.  Your chest or throat feels tight.  You have trouble breathing or swallowing. These symptoms may represent a serious problem that is an emergency. Do not wait to see if the symptoms will go away. Get medical help right away. Call your local emergency services (911 in the [place] Do not drive yourself to the hospital. Summary  Hives (urticaria) are itchy, red, swollen areas on your skin. Hives come from the body's reaction to something a person is allergic to (allergen), something that causes irritation, or various other triggers.  Treatment for this condition depends on the cause and severity of your symptoms.  Avoid any substances that cause your hives.  Keep a journal to help track what causes your hives.  Take and apply over-the-counter and prescription medicines only as told by your health care provider.  Keep all follow-up visits as told by your health care provider. This is important. This information is not intended to replace advice given to you by your health care provider. Make sure you discuss any questions you have with your health care provider. Document Released: 02/19/2005 Document Revised: 09/04/2017 Document Reviewed: 09/04/2017 Elsevier Patient Education  2020 [employer].

## 2018-10-23 NOTE — Progress Notes (Signed)
Date:  10/23/2018   Name:  Kimberly Hayes   DOB:  [birth year]   MRN:  [id number]   Chief Complaint: Rash (happens "in my house"- has woke up with swelling, red, circular places on foot, arms, leg, neck and chest x 1 week )  Rash This is a new problem. The current episode started in the past 7 days. The problem has been waxing and waning since onset. The rash is diffuse. The rash is characterized by redness (target-like/urticarial /tender/ wheal). She was exposed to nothing. Pertinent negatives include no anorexia, congestion, cough, diarrhea, eye pain, facial edema, fatigue, fever, joint pain, nail changes, rhinorrhea, shortness of breath, sore throat or vomiting. Past treatments include nothing.    Review of Systems  Constitutional: Negative.  Negative for chills, fatigue, fever and unexpected weight change.  HENT: Negative for congestion, ear discharge, ear pain, rhinorrhea, sinus pressure, sneezing and sore throat.   Eyes: Negative for photophobia, pain, discharge, redness and itching.  Respiratory: Negative for cough, shortness of breath, wheezing and stridor.   Gastrointestinal: Negative for abdominal pain, anorexia, blood in stool, constipation, diarrhea, nausea and vomiting.  Endocrine: Negative for cold intolerance, heat intolerance, polydipsia, polyphagia and polyuria.  Genitourinary: Negative for dysuria, flank pain, frequency, hematuria, menstrual problem, pelvic pain, urgency, vaginal bleeding and vaginal discharge.  Musculoskeletal: Negative for arthralgias, back pain, joint pain and myalgias.  Skin: Positive for rash. Negative for nail changes.  Allergic/Immunologic: Negative for environmental allergies and food allergies.  Neurological: Negative for dizziness, weakness, light-headedness, numbness and headaches.  Hematological: Negative for adenopathy. Does not bruise/bleed easily.  Psychiatric/Behavioral: Negative for dysphoric mood. The patient is not nervous/anxious.     There are no active problems to display for this patient.   No Known Allergies  Past Surgical History:  Procedure Laterality Date  . NO PAST SURGERIES      Social History   Tobacco Use  . Smoking status: Current Every Day Smoker    Years: 2.00    Types: E-cigarettes    Last attempt to quit: 08/17/2015    Years since quitting: 3.1  . Smokeless tobacco: Never Used  . Tobacco comment: smoking cessation discussed  Substance Use Topics  . Alcohol use: Not Currently    Frequency: Never  . Drug use: Not Currently    Comment: recovering Heroin Addict      Medication list has been reviewed and updated.  Current Meds  Medication Sig  . Buprenorphine HCl-Naloxone HCl 8-2 MG FILM DISSOLVE 1 FILM UNDER THE TONGUE TWO TIMES A DAY  . desvenlafaxine (PRISTIQ) 50 MG 24 hr tablet Take 50 mg by mouth daily. [place] [facility]) Inject into the skin.  Marland Kitchen gabapentin (NEURONTIN) 300 MG capsule Take 300 mg by mouth 3 (three) times daily.  Marland Kitchen NARCAN 4 MG/0.1ML LIQD nasal spray kit     PHQ 2/9 Scores 06/18/2018 05/14/2018  PHQ - 2 Score 3 4  PHQ- 9 Score 14 21    BP Readings from Last 3 Encounters:  10/23/18 120/70  06/25/18 122/74  06/18/18 112/64    Physical Exam Vitals signs and nursing note reviewed.  Constitutional:      Appearance: She is well-developed.  HENT:     Head: Normocephalic.     Right Ear: External ear normal.     Left Ear: External ear normal.  Eyes:     General: Lids are everted, no foreign bodies appreciated. No scleral icterus.  Left eye: No foreign body or hordeolum.     Conjunctiva/sclera: Conjunctivae normal.     Right eye: Right conjunctiva is not injected.     Left eye: Left conjunctiva is not injected.     Pupils: Pupils are equal, round, and reactive to light.  Neck:     Musculoskeletal: Normal range of motion and neck supple.     Thyroid: No thyromegaly.     Vascular: No JVD.     Trachea: No tracheal  deviation.  Cardiovascular:     Rate and Rhythm: Normal rate and regular rhythm.     Heart sounds: Normal heart sounds. No murmur. No friction rub. No gallop.   Pulmonary:     Effort: Pulmonary effort is normal. No respiratory distress.     Breath sounds: Normal breath sounds. No wheezing or rales.  Abdominal:     General: Bowel sounds are normal.     Palpations: Abdomen is soft. There is no mass.     Tenderness: There is no abdominal tenderness. There is no guarding or rebound.  Musculoskeletal: Normal range of motion.        General: No tenderness.  Lymphadenopathy:     Cervical: No cervical adenopathy.  Skin:    General: Skin is warm.     Capillary Refill: Capillary refill takes less than 2 seconds.     Findings: Bruising, erythema and rash present. Rash is macular.  Neurological:     Mental Status: She is alert and oriented to person, place, and time.     Cranial Nerves: No cranial nerve deficit.     Deep Tendon Reflexes: Reflexes normal.  Psychiatric:        Mood and Affect: Mood is not anxious or depressed.     Wt Readings from Last 3 Encounters:  10/23/18 139 lb (63 kg)  06/25/18 143 lb (64.9 kg)  06/18/18 145 lb (65.8 kg)    BP 120/70   Pulse 64   Ht '5\' 4"'  (1.626 m)   Wt 139 lb (63 kg)   BMI 23.86 kg/m   Assessment and Plan: 1. Urticaria Patient has new onset of a rash in multiple areas arms legs torso that began as pruritic will patient like areas which began as a centralized erythematous patch and then expands L like a target.  This sounds like urticaria that evolved into like a -erythema multiforme presentation.  Patient was given triamcinolone to use in the early stages because of the pruritus.  And will refer to dermatology for them to observe pictures and add further comment on treatment.  In the meantime we will initiate Zyrtec in the morning and a Benadryl at night for control of the histamine response. - Ambulatory referral to Dermatology - triamcinolone  cream (KENALOG) 0.1 %; Apply 1 application topically 2 (two) times daily.  Dispense: 30 g; Refill: 0 - CBC with Differential/Platelet  2. Erythema multiforme Because of the target-like areas this is suggestive of erythema multiforme we reviewed medications. - Ambulatory referral to Dermatology - CBC with Differential/Platelet  3. Ecchymosis Patient is left with some bruising which may be due to the active scratching the areas and traumatic dermal reaction.  We will check a CBC to make sure that there is no cytology of concern. - Ambulatory referral to Dermatology - CBC with Differential/Platelet  Patient has been advised of the health risks of smoking and counseled concerning cessation of tobacco products. I spent over 3 minutes for discussion and to answer questions.

## 2018-12-26 ENCOUNTER — Ambulatory Visit (INDEPENDENT_AMBULATORY_CARE_PROVIDER_SITE_OTHER): Payer: 59 | Admitting: Family Medicine

## 2018-12-26 ENCOUNTER — Encounter: Payer: Self-pay | Admitting: Family Medicine

## 2018-12-26 ENCOUNTER — Other Ambulatory Visit: Payer: Self-pay

## 2018-12-26 VITALS — BP 108/69 | HR 89 | Resp 16 | Ht 64.0 in | Wt 139.0 lb

## 2018-12-26 DIAGNOSIS — L309 Dermatitis, unspecified: Secondary | ICD-10-CM | POA: Diagnosis not present

## 2018-12-26 DIAGNOSIS — R21 Rash and other nonspecific skin eruption: Secondary | ICD-10-CM | POA: Diagnosis not present

## 2018-12-26 DIAGNOSIS — Z23 Encounter for immunization: Secondary | ICD-10-CM

## 2018-12-26 DIAGNOSIS — L239 Allergic contact dermatitis, unspecified cause: Secondary | ICD-10-CM | POA: Diagnosis not present

## 2018-12-26 MED ORDER — DOXEPIN HCL 10 MG PO CAPS: 10.0000 mg | ORAL_CAPSULE | Freq: Every day | ORAL | 1 refills | Status: DC

## 2018-12-26 MED ORDER — TRIAMCINOLONE ACETONIDE 0.1 % EX CREA: 1.0000 "application " | TOPICAL_CREAM | Freq: Two times a day (BID) | CUTANEOUS | 0 refills | Status: DC

## 2018-12-26 NOTE — Patient Instructions (Addendum)
Scabies, Adult  Scabies is a skin condition that happens when very small insects get under the skin (infestation). This causes a rash and severe itchiness. Scabies can spread from person to person (is contagious). If you get scabies, it is common for others in your household to get scabies too. With proper treatment, symptoms usually go away in 2-4 weeks. Scabies usually does not cause lasting problems. What are the causes? This condition is caused by tiny mites (Sarcoptes scabiei, or human itch mites) that can only be seen with a microscope. The mites get into the top layer of skin and lay eggs. Scabies can spread from person to person through:  Close contact with a person who has scabies.  Sharing or having contact with infested items, such as towels, bedding, or clothing. What increases the risk? The following factors may make you more likely to develop this condition:  Living in a nursing home or other extended care facility.  Having sexual contact with a partner who has scabies.  Caring for others who are at increased risk for scabies. What are the signs or symptoms? Symptoms of this condition include:  Severe itchiness. This is often worse at night.  A rash that includes tiny red bumps or blisters. The rash commonly occurs on the hands, wrists, elbows, armpits, chest, waist, groin, or buttocks. The bumps may form a line (burrow) in some areas.  Skin irritation. This can include scaly patches or sores. How is this diagnosed? This condition may be diagnosed based on:  A physical exam of the skin.  A skin test. Your health care provider may take a sample of your affected skin (skin scraping) and have it examined under a microscope for signs of mites. How is this treated? This condition may be treated with:  Medicated cream or lotion that kills the mites. This is spread on the entire body and left on for several hours. Usually, one treatment with medicated cream or lotion is  enough to kill all the mites. In severe cases, the treatment may need to be repeated.  Medicated cream that relieves itching.  Medicines taken by mouth (orally) that: ? Relieve itching. ? Reduce the swelling and redness. ? Kill the mites. This treatment may be done in severe cases. Follow these instructions at home: Medicines   Take or apply over-the-counter and prescription medicines as told by your health care provider.  Apply medicated cream or lotion as told by your health care provider.  Do not wash off the medicated cream or lotion until the necessary amount of time has passed. Skin care   Avoid scratching the affected areas of your skin.  Keep your fingernails closely trimmed to reduce injury from scratching.  Take cool baths or apply cool washcloths to your skin to help reduce itching. General instructions  Clean all items that you recently had contact with, including bedding, clothing, and furniture. Do this on the same day that you start treatment. ? Dry clean items, or use hot water to wash items. Dry items on the hot dry cycle. ? Place items that cannot be washed into closed, airtight plastic bags for at least 3 days. The mites cannot live for more than 3 days away from human skin. ? Vacuum furniture and mattresses that you use.  Make sure that other people who may have been infested are examined by a health care provider. These include members of your household and anyone who may have had contact with infested items.  Keep all follow-up   visits as told by your health care provider. This is important. Contact a health care provider if:  You have itching that does not go away after 4 weeks of treatment.  You continue to develop new bumps or burrows.  You have redness, swelling, or pain in your rash area after treatment.  You have fluid, blood, or pus coming from your rash. Summary  Scabies is a skin condition that causes a rash and severe itchiness.  This  condition is caused by tiny mites that get into the top layer of the skin and lay eggs.  Scabies can spread from person to person.  Follow treatments as recommended by your health care provider.  Clean all items that you recently had contact with. This information is not intended to replace advice given to you by your health care provider. Make sure you discuss any questions you have with your health care provider. Document Released: 11/10/2014 Document Revised: 12/25/2017 Document Reviewed: 12/25/2017 Elsevier Patient Education  2020 [employer].  Eczema Eczema is a broad term for a group of skin conditions that cause skin to become rough and inflamed. Each type of eczema has different triggers, symptoms, and treatments. Eczema of any type is usually itchy and symptoms range from mild to severe. Eczema and its symptoms are not spread from person to person (are not contagious). It can appear on different parts of the body at different times. Your eczema may not look the same as someone else's eczema. What are the types of eczema? Atopic dermatitis This is a long-term (chronic) skin disease that keeps coming back (recurring). Usual symptoms are dry skin and small, solid pimples that may swell and leak fluid (weep). Contact dermatitis  This happens when something irritates the skin and causes a rash. The irritation can come from substances that you are allergic to (allergens), such as poison ivy, chemicals, or medicines that were applied to your skin. Dyshidrotic eczema This is a form of eczema on the hands and feet. It shows up as very itchy, fluid-filled blisters. It can affect people of any age, but is more common before age 53. Hand eczema  This causes very itchy areas of skin on the palms and sides of the hands and fingers. This type of eczema is common in industrial jobs where you may be exposed to many different types of irritants. Lichen simplex chronicus This type of eczema occurs  when a person constantly scratches one area of the body. Repeated scratching of the area leads to thickened skin (lichenification). Lichen simplex chronicus can occur along with other types of eczema. It is more common in adults, but may be seen in children as well. Nummular eczema This is a common type of eczema. It has no known cause. It typically causes a red, circular, crusty lesion (plaque) that may be itchy. Scratching may become a habit and can cause bleeding. Nummular eczema occurs most often in people of middle-age or older. It most often affects the hands. Seborrheic dermatitis This is a common skin disease that mainly affects the scalp. It may also affect any oily areas of the body, such as the face, sides of nose, eyebrows, ears, eyelids, and chest. It is marked by small scaling and redness of the skin (erythema). This can affect people of all ages. In infants, this condition is known as Chart[employer] certified accountant." Stasis dermatitis This is a common skin disease that usually appears on the legs and feet. It most often occurs in people who have a condition  that prevents blood from being pumped through the veins in the legs (chronic venous insufficiency). Stasis dermatitis is a chronic condition that needs long-term management. How is eczema diagnosed? Your health care provider will examine your skin and review your medical history. He or she may also give you skin patch tests. These tests involve taking patches that contain possible allergens and placing them on your back. He or she will then check in a few days to see if an allergic reaction occurred. What are the common treatments? Treatment for eczema is based on the type of eczema you have. Hydrocortisone steroid medicine can relieve itching quickly and help reduce inflammation. This medicine may be prescribed or obtained over-the-counter, depending on the strength of the medicine that is needed. Follow these instructions at home:  Take  over-the-counter and prescription medicines only as told by your health care provider.  Use creams or ointments to moisturize your skin. Do not use lotions.  Learn what triggers or irritates your symptoms. Avoid these things.  Treat symptom flare-ups quickly.  Do not itch your skin. This can make your rash worse.  Keep all follow-up visits as told by your health care provider. This is important. Where to find more information  The American Academy of Dermatology: InfoExam.si  The National Eczema Association: www.nationaleczema.org Contact a health care provider if:  You have serious itching, even with treatment.  You regularly scratch your skin until it bleeds.  Your rash looks different than usual.  Your skin is painful, swollen, or more red than usual.  You have a fever. Summary  There are eight general types of eczema. Each type has different triggers.  Eczema of any type causes itching that may range from mild to severe.  Treatment varies based on the type of eczema you have. Hydrocortisone steroid medicine can help with itching and inflammation.  Protecting your skin is the best way to prevent eczema. Use moisturizers and lotions. Avoid triggers and irritants, and treat flare-ups quickly. This information is not intended to replace advice given to you by your health care provider. Make sure you discuss any questions you have with your health care provider. Document Released: 07/05/2016 Document Revised: 02/01/2017 Document Reviewed: 07/05/2016 Elsevier Patient Education  2020 [employer].

## 2018-12-26 NOTE — Progress Notes (Signed)
Date:  12/26/2018   Name:  Kimberly Hayes   DOB:  13-Apr-1980   MRN:  [id number]   Chief Complaint: Rash (R leg )  Rash This is a chronic problem. The current episode started more than 1 month ago (since August). The problem is unchanged. The affected locations include the right lower leg. The rash is characterized by itchiness. She was exposed to a new medication (pristiQ /6 month). Pertinent negatives include no congestion, cough, diarrhea, eye pain, fatigue, fever, rhinorrhea, shortness of breath, sore throat or vomiting. Past treatments include antihistamine and topical steroids. The treatment provided no relief. Her past medical history is significant for eczema.    Review of Systems  Constitutional: Negative.  Negative for chills, fatigue, fever and unexpected weight change.  HENT: Negative for congestion, ear discharge, ear pain, rhinorrhea, sinus pressure, sneezing and sore throat.   Eyes: Negative for photophobia, pain, discharge, redness and itching.  Respiratory: Negative for cough, shortness of breath, wheezing and stridor.   Cardiovascular: Negative for chest pain, palpitations and leg swelling.  Gastrointestinal: Negative for abdominal pain, blood in stool, constipation, diarrhea, nausea and vomiting.  Endocrine: Negative for cold intolerance, heat intolerance, polydipsia, polyphagia and polyuria.  Genitourinary: Negative for dysuria, flank pain, frequency, hematuria, menstrual problem, pelvic pain, urgency, vaginal bleeding and vaginal discharge.  Musculoskeletal: Negative for arthralgias, back pain and myalgias.  Skin: Positive for rash. Negative for pallor.  Allergic/Immunologic: Negative for environmental allergies and food allergies.  Neurological: Negative for dizziness, weakness, light-headedness, numbness and headaches.  Hematological: Negative for adenopathy. Does not bruise/bleed easily.  Psychiatric/Behavioral: Negative for dysphoric mood. The patient is not  nervous/anxious.     There are no active problems to display for this patient.   No Known Allergies  Past Surgical History:  Procedure Laterality Date  . NO PAST SURGERIES      Social History   Tobacco Use  . Smoking status: Current Every Day Smoker    Years: 2.00    Types: E-cigarettes    Last attempt to quit: 08/17/2015    Years since quitting: 3.3  . Smokeless tobacco: Never Used  . Tobacco comment: smoking cessation discussed  Substance Use Topics  . Alcohol use: Not Currently    Frequency: Never  . Drug use: Not Currently    Comment: recovering Heroin Addict      Medication list has been reviewed and updated.  Current Meds  Medication Sig  . Buprenorphine HCl-Naloxone HCl 8-2 MG FILM DISSOLVE 1 FILM UNDER THE TONGUE TWO TIMES A DAY  . desvenlafaxine (PRISTIQ) 50 MG 24 hr tablet Take 50 mg by mouth daily. [facility]) Inject into the skin.  Marland Kitchen gabapentin (NEURONTIN) 300 MG capsule Take 300 mg by mouth 3 (three) times daily.  Marland Kitchen NARCAN 4 MG/0.1ML LIQD nasal spray kit   . triamcinolone cream (KENALOG) 0.1 % Apply 1 application topically 2 (two) times daily.    PHQ 2/9 Scores 06/18/2018 05/14/2018  PHQ - 2 Score 3 4  PHQ- 9 Score 14 21    BP Readings from Last 3 Encounters:  12/26/18 108/69  10/23/18 120/70  06/25/18 122/74    Physical Exam Vitals signs and nursing note reviewed.  Pulmonary:     Effort: No respiratory distress.     Breath sounds: No stridor. No wheezing or rhonchi.     Wt Readings from Last 3 Encounters:  12/26/18 139 lb (63 kg)  10/23/18 139 lb (63 kg)  06/25/18 143 lb (64.9 kg)    BP 108/69   Pulse 89   Resp 16   Ht '5\' 4"'  (1.626 m)   Wt 139 lb (63 kg)   SpO2 98%   BMI 23.86 kg/m   Assessment and Plan:   1. Need for diphtheria-tetanus-pertussis (Tdap) vaccine Discussed and administered  2. Eczema, unspecified type Patient has prior to this breaking out had some episodes that are  suggestive of an eczematous-like rash on her feet and arms patient initially was suggested to be on antihistamines but did not follow-up on this this is been reemphasized she will take Zyrtec in the morning 10 mg doxepin at night and patient had her triamcinolone refilled and samples of Eucrisa were given. - doxepin (SINEQUAN) 10 MG capsule; Take 1 capsule (10 mg total) by mouth at bedtime.  Dispense: 30 capsule; Refill: 1 - Ambulatory referral to Dermatology - triamcinolone cream (KENALOG) 0.1 %; Apply 1 application topically 2 (two) times daily.  Dispense: 30 g; Refill: 0  3. Allergic contact dermatitis, unspecified trigger See above - doxepin (SINEQUAN) 10 MG capsule; Take 1 capsule (10 mg total) by mouth at bedtime.  Dispense: 30 capsule; Refill: 1 - Ambulatory referral to Dermatology - triamcinolone cream (KENALOG) 0.1 %; Apply 1 application topically 2 (two) times daily.  Dispense: 30 g; Refill: 0  4. Rash The other rash or little punctate bite-like areas that are in a line that are suggestive of an insect like possibility patient does not shave her legs that would suggest a folliculitis or staph infection but they are about 3 to 4 mm in size with an erythematous base and a center vesicle/pustule.  We have discussed the possibility of scabies or bedbugs or some other insect like possibility and patient does not think this is likely but is willing to consider the possibility.  Will refer to dermatology with pictures to help with determination. - doxepin (SINEQUAN) 10 MG capsule; Take 1 capsule (10 mg total) by mouth at bedtime.  Dispense: 30 capsule; Refill: 1 - Ambulatory referral to Dermatology - triamcinolone cream (KENALOG) 0.1 %; Apply 1 application topically 2 (two) times daily.  Dispense: 30 g; Refill: 0

## 2019-02-12 ENCOUNTER — Other Ambulatory Visit: Payer: Self-pay

## 2019-02-12 ENCOUNTER — Ambulatory Visit (INDEPENDENT_AMBULATORY_CARE_PROVIDER_SITE_OTHER): Payer: Managed Care, Other (non HMO) | Admitting: Family Medicine

## 2019-02-12 ENCOUNTER — Encounter: Payer: Self-pay | Admitting: Family Medicine

## 2019-02-12 VITALS — BP 110/70 | HR 80 | Temp 98.7°F | Ht 64.0 in | Wt 138.0 lb

## 2019-02-12 DIAGNOSIS — J0100 Acute maxillary sinusitis, unspecified: Secondary | ICD-10-CM

## 2019-02-12 DIAGNOSIS — J029 Acute pharyngitis, unspecified: Secondary | ICD-10-CM

## 2019-02-12 MED ORDER — AMOXICILLIN 500 MG PO CAPS: 500.0000 mg | ORAL_CAPSULE | Freq: Three times a day (TID) | ORAL | 0 refills | Status: DC

## 2019-02-12 NOTE — Progress Notes (Signed)
Date:  02/12/2019   Name:  Kimberly Hayes   DOB:  [birth year]   MRN:  [id number]   Chief Complaint: Sore Throat (x 2 days- hurts to swallow, bloody streaked production from throat)  Sore Throat  This is a new problem. The current episode started in the past 7 days (day before yesterday). The problem has been gradually worsening. The pain is worse on the left side. There has been no fever. The pain is moderate. Associated symptoms include congestion and shortness of breath. Pertinent negatives include no abdominal pain, coughing, diarrhea, drooling, ear discharge, ear pain, headaches, hoarse voice, plugged ear sensation, neck pain, swollen glands, trouble swallowing or vomiting. She has had no exposure to strep or mono.  Sinusitis This is a new problem. The current episode started yesterday. The problem has been gradually worsening since onset. There has been no fever. The pain is moderate. Associated symptoms include congestion, shortness of breath and sinus pressure. Pertinent negatives include no chills, coughing, diaphoresis, ear pain, headaches, hoarse voice, neck pain, sneezing, sore throat or swollen glands. (Bloody nasal discharge)    Lab Results  Component Value Date   CREATININE 0.87 05/14/2018   BUN 11 05/14/2018   NA 138 05/14/2018   K 4.9 05/14/2018   CL 102 05/14/2018   CO2 23 05/14/2018   No results found for: CHOL, HDL, LDLCALC, LDLDIRECT, TRIG, CHOLHDL Lab Results  Component Value Date   TSH 1.170 05/14/2018   No results found for: HGBA1C   Review of Systems  Constitutional: Negative for chills, diaphoresis and fever.  HENT: Positive for congestion and sinus pressure. Negative for drooling, ear discharge, ear pain, hoarse voice, sneezing, sore throat and trouble swallowing.   Respiratory: Positive for shortness of breath. Negative for cough and wheezing.   Cardiovascular: Negative for chest pain, palpitations and leg swelling.  Gastrointestinal: Negative for  abdominal pain, blood in stool, constipation, diarrhea, nausea and vomiting.  Endocrine: Negative for polydipsia.  Genitourinary: Negative for dysuria, frequency, hematuria and urgency.  Musculoskeletal: Negative for back pain, myalgias and neck pain.  Skin: Negative for rash.  Allergic/Immunologic: Negative for environmental allergies.  Neurological: Negative for dizziness and headaches.  Hematological: Does not bruise/bleed easily.  Psychiatric/Behavioral: Negative for suicidal ideas. The patient is not nervous/anxious.     There are no problems to display for this patient.   No Known Allergies  Past Surgical History:  Procedure Laterality Date  . NO PAST SURGERIES      Social History   Tobacco Use  . Smoking status: Current Every Day Smoker    Years: 2.00    Types: E-cigarettes    Last attempt to quit: 08/17/2015    Years since quitting: 3.4  . Smokeless tobacco: Never Used  . Tobacco comment: smoking cessation discussed  Substance Use Topics  . Alcohol use: Not Currently  . Drug use: Not Currently    Comment: recovering Heroin Addict      Medication list has been reviewed and updated.  Current Meds  Medication Sig  . Buprenorphine HCl-Naloxone HCl 8-2 MG FILM DISSOLVE 1 FILM UNDER THE TONGUE TWO TIMES A DAY  . desvenlafaxine (PRISTIQ) 50 MG 24 hr tablet Take 50 mg by mouth daily. [facility]) Inject into the skin.  Marland Kitchen gabapentin (NEURONTIN) 300 MG capsule Take 300 mg by mouth 2 (two) times daily.   Marland Kitchen NARCAN 4 MG/0.1ML LIQD nasal spray kit   . triamcinolone cream (KENALOG) 0.1 %  Apply 1 application topically 2 (two) times daily.    PHQ 2/9 Scores 02/12/2019 06/18/2018 05/14/2018  PHQ - 2 Score '4 3 4  ' PHQ- 9 Score '12 14 21    ' BP Readings from Last 3 Encounters:  02/12/19 110/70  12/26/18 108/69  10/23/18 120/70    Physical Exam Vitals and nursing note reviewed.  Constitutional:      Appearance: She is  well-developed.  HENT:     Head: Normocephalic.     Jaw: There is normal jaw occlusion.     Right Ear: Hearing, tympanic membrane, ear canal and external ear normal.     Left Ear: Hearing, tympanic membrane, ear canal and external ear normal.     Nose: No congestion or rhinorrhea.     Right Turbinates: Swollen.     Left Turbinates: Swollen.     Right Sinus: No maxillary sinus tenderness or frontal sinus tenderness.     Left Sinus: Maxillary sinus tenderness present. No frontal sinus tenderness.     Mouth/Throat:     Lips: Pink.     Tongue: No lesions.     Palate: No mass.     Pharynx: Pharyngeal swelling and posterior oropharyngeal erythema present. No oropharyngeal exudate or uvula swelling.     Tonsils: No tonsillar exudate.  Eyes:     General: Lids are everted, no foreign bodies appreciated. No scleral icterus.       Left eye: No foreign body or hordeolum.     Conjunctiva/sclera: Conjunctivae normal.     Right eye: Right conjunctiva is not injected.     Left eye: Left conjunctiva is not injected.     Pupils: Pupils are equal, round, and reactive to light.  Neck:     Thyroid: No thyromegaly.     Vascular: No JVD.     Trachea: No tracheal deviation.  Cardiovascular:     Rate and Rhythm: Normal rate and regular rhythm.     Heart sounds: Normal heart sounds. No murmur. No friction rub. No gallop.   Pulmonary:     Effort: Pulmonary effort is normal. No respiratory distress.     Breath sounds: Normal breath sounds. No wheezing or rales.  Abdominal:     General: Bowel sounds are normal.     Palpations: Abdomen is soft. There is no mass.     Tenderness: There is no abdominal tenderness. There is no guarding or rebound.  Musculoskeletal:        General: No tenderness. Normal range of motion.     Cervical back: Normal range of motion and neck supple.  Lymphadenopathy:     Cervical: No cervical adenopathy.     Right cervical: No superficial, deep or posterior cervical adenopathy.     Left cervical: No superficial, deep or posterior cervical adenopathy.  Skin:    General: Skin is warm.     Findings: No rash.  Neurological:     Mental Status: She is alert and oriented to person, place, and time.     Cranial Nerves: No cranial nerve deficit.     Deep Tendon Reflexes: Reflexes normal.  Psychiatric:        Mood and Affect: Mood is not anxious or depressed.     Wt Readings from Last 3 Encounters:  02/12/19 138 lb (62.6 kg)  12/26/18 139 lb (63 kg)  10/23/18 139 lb (63 kg)    BP 110/70   Pulse 80   Temp 98.7 F (37.1 C) (Oral)   Ht 5'  4" (1.626 m)   Wt 138 lb (62.6 kg)   BMI 23.69 kg/m   Assessment and Plan:  1. Acute non-recurrent maxillary sinusitis Acute.  Persistent.  Patient with symptoms and exam consistent with sinusitis of the left maxillary sinus.  Will initiate amoxicillin 500 mg 3 times a day for 10 days. - amoxicillin (AMOXIL) 500 MG capsule; Take 1 capsule (500 mg total) by mouth 3 (three) times daily.  Dispense: 30 capsule; Refill: 0  2. Pharyngitis, unspecified etiology Mild erythema.  Acute.  Likely of a viral nature or residual from the upper respiratory infection of sinus issue.  No antibiotic needed for this although the amoxicillin will take care of any streptococcal concern.

## 2019-05-27 ENCOUNTER — Other Ambulatory Visit: Payer: Self-pay

## 2019-05-27 ENCOUNTER — Ambulatory Visit (INDEPENDENT_AMBULATORY_CARE_PROVIDER_SITE_OTHER): Payer: 59 | Admitting: Family Medicine

## 2019-05-27 ENCOUNTER — Encounter: Payer: Self-pay | Admitting: Family Medicine

## 2019-05-27 VITALS — BP 110/70 | HR 60 | Ht 64.0 in | Wt 134.0 lb

## 2019-05-27 DIAGNOSIS — R002 Palpitations: Secondary | ICD-10-CM | POA: Diagnosis not present

## 2019-05-27 DIAGNOSIS — F419 Anxiety disorder, unspecified: Secondary | ICD-10-CM

## 2019-05-27 DIAGNOSIS — R55 Syncope and collapse: Secondary | ICD-10-CM

## 2019-05-27 NOTE — Progress Notes (Signed)
Date:  05/27/2019   Name:  Kimberly Hayes   DOB:  1980/12/04   MRN:  [id number]   Chief Complaint: Panic Attack (episodes of starting to feel anxious and tingling and then breaks out in a sweat and then has cold chills- after episode it takes "a bit" to get back to normal/ gathering thoughts as to what she was doing before it happened.)  Neurologic Problem The patient's primary symptoms include a loss of balance, near-syncope, a visual change and weakness. The patient's pertinent negatives include no altered mental status, clumsiness, focal sensory loss, focal weakness, memory loss, slurred speech or syncope. Primary symptoms comment: post confusion/flush/. This is a recurrent problem. The current episode started in the past 7 days (onset Friday). The neurological problem developed insidiously (duration/couple minutes/every 1-2 hours). There was no focality noted. Associated symptoms include confusion, diaphoresis, dizziness, light-headedness and palpitations. Pertinent negatives include no abdominal pain, auditory change, aura, back pain, bladder incontinence, bowel incontinence, chest pain, fatigue, fever, headaches, nausea, neck pain, shortness of breath, vertigo or vomiting. Treatments tried: sit on floor. There is no history of a CVA, head trauma or seizures.  Palpitations  This is a recurrent problem. The current episode started in the past 7 days. The problem occurs intermittently. Associated symptoms include anxiety, diaphoresis, dizziness, near-syncope and weakness. Pertinent negatives include no chest fullness, chest pain, coughing, fever, irregular heartbeat, malaise/fatigue, nausea, numbness, shortness of breath, syncope or vomiting. She has tried deep relaxation and breathing exercises for the symptoms. The treatment provided mild relief. Her past medical history is significant for anxiety and drug use.    Lab Results  Component Value Date   CREATININE 0.87 05/14/2018   BUN 11  05/14/2018   NA 138 05/14/2018   K 4.9 05/14/2018   CL 102 05/14/2018   CO2 23 05/14/2018   No results found for: CHOL, HDL, LDLCALC, LDLDIRECT, TRIG, CHOLHDL Lab Results  Component Value Date   TSH 1.170 05/14/2018   No results found for: HGBA1C Lab Results  Component Value Date   WBC 4.6 05/14/2018   HGB 13.2 05/14/2018   HCT 40.3 05/14/2018   MCV 91 05/14/2018   PLT 225 05/14/2018   Lab Results  Component Value Date   ALT 11 05/14/2018   AST 12 05/14/2018   ALKPHOS 56 05/14/2018   BILITOT 0.4 05/14/2018     Review of Systems  Constitutional: Positive for diaphoresis. Negative for chills, fatigue, fever, malaise/fatigue and unexpected weight change.  HENT: Negative for congestion, ear discharge, ear pain, rhinorrhea, sinus pressure, sneezing and sore throat.   Eyes: Negative for photophobia, pain, discharge, redness and itching.  Respiratory: Negative for cough, shortness of breath, wheezing and stridor.   Cardiovascular: Positive for palpitations and near-syncope. Negative for chest pain and syncope.  Gastrointestinal: Negative for abdominal pain, blood in stool, bowel incontinence, constipation, diarrhea, nausea and vomiting.  Endocrine: Negative for cold intolerance, heat intolerance, polydipsia, polyphagia and polyuria.  Genitourinary: Negative for bladder incontinence, dysuria, flank pain, frequency, hematuria, menstrual problem, pelvic pain, urgency, vaginal bleeding and vaginal discharge.  Musculoskeletal: Negative for arthralgias, back pain, myalgias and neck pain.  Skin: Negative for rash.  Allergic/Immunologic: Negative for environmental allergies and food allergies.  Neurological: Positive for dizziness, weakness, light-headedness and loss of balance. Negative for vertigo, tremors, focal weakness, seizures, syncope, facial asymmetry, speech difficulty, numbness and headaches.  Hematological: Negative for adenopathy. Does not bruise/bleed easily.    Psychiatric/Behavioral: Positive for confusion. Negative for dysphoric mood and  memory loss. The patient is nervous/anxious.     There are no problems to display for this patient.   No Known Allergies  Past Surgical History:  Procedure Laterality Date  . NO PAST SURGERIES      Social History   Tobacco Use  . Smoking status: Current Every Day Smoker    Years: 2.00    Types: E-cigarettes    Last attempt to quit: 08/17/2015    Years since quitting: 3.7  . Smokeless tobacco: Never Used  . Tobacco comment: smoking cessation discussed  Substance Use Topics  . Alcohol use: Not Currently  . Drug use: Not Currently    Comment: recovering Heroin Addict      Medication list has been reviewed and updated.  Current Meds  Medication Sig  . Buprenorphine HCl-Naloxone HCl 8-2 MG FILM DISSOLVE 1 FILM UNDER THE TONGUE TWO TIMES A DAY  . Etonogestrel (NEXPLANON [place]) Inject into the skin.  Marland Kitchen gabapentin (NEURONTIN) 300 MG capsule Take 300 mg by mouth 2 (two) times daily.   Marland Kitchen NARCAN 4 MG/0.1ML LIQD nasal spray kit   . sertraline (ZOLOFT) 50 MG tablet Take 50 mg by mouth daily.    PHQ 2/9 Scores 05/27/2019 02/12/2019 06/18/2018 05/14/2018  PHQ - 2 Score _0 PHQ- 9 Score _1 BP Readings from Last 3 Encounters:  05/27/19 110/70  02/12/19 110/70  12/26/18 108/69    Physical Exam Vitals and nursing note reviewed.  Constitutional:      General: She is not in acute distress.    Appearance: She is not diaphoretic.  HENT:     Head: Normocephalic and atraumatic.     Right Ear: Tympanic membrane, ear canal and external ear normal.     Left Ear: Tympanic membrane, ear canal and external ear normal.     Nose: Nose normal.  Eyes:     General:        Right eye: No discharge.        Left eye: No discharge.     Conjunctiva/sclera: Conjunctivae normal.     Pupils: Pupils are equal, round, and reactive to light.  Neck:     Thyroid: No thyromegaly.     Vascular: No JVD.   Cardiovascular:     Rate and Rhythm: Normal rate and regular rhythm.     Heart sounds: Normal heart sounds. No murmur. No friction rub. No gallop.   Pulmonary:     Effort: Pulmonary effort is normal.     Breath sounds: Normal breath sounds.  Abdominal:     General: Bowel sounds are normal.     Palpations: Abdomen is soft. There is no mass.     Tenderness: There is no abdominal tenderness. There is no guarding.  Musculoskeletal:        General: Normal range of motion.     Cervical back: Normal range of motion and neck supple.  Lymphadenopathy:     Cervical: No cervical adenopathy.  Skin:    General: Skin is warm and dry.  Neurological:     Mental Status: She is alert.     Deep Tendon Reflexes: Reflexes are normal and symmetric.     Wt Readings from Last 3 Encounters:  05/27/19 134 lb (60.8 kg)  02/12/19 138 lb (62.6 kg)  12/26/18 139 lb (63 kg)    BP 110/70   Pulse 60   Ht _2  (1.626 m)   Wt 134 lb (60.8 kg)  BMI 23.00 kg/m   Assessment and Plan: Patient presents with symptoms of diaphoresis, dizziness, and palpitations of a sudden nature. 1. Palpitations Patient's had palpitations noted with these without chest discomfort.  EKG was obtained with the following results: Patient is noted to have significant bradycardia with a rate of 45.  Other than that the intervals are normal the rhythm is normal sinus with bradycardia.  There is no criteria met for LVH.  There was no suggestion of ischemic changes such as Q waves, ST-T wave changes, nor delay of R wave progression.I have reviewed EKG which shows as previously noted. Comparison to previous EKG dated none to compare to - EKG 12-Lead  2. Near syncope Patient has to sit down to alleviate the symptoms of these episodes.  Although there is no tonic-clonic the only other thing of concern would be if there is a component of neurologic concerns such as absence seizures.  This is given that the patient does have a period  afterwards that seems to be almost post ictal.  We will obtain a renal function panel and a CBC for evaluation and if this continues our next step is to refer to neurology. - Renal Function Panel - CBC with Differential/Platelet  3. Anxiety This becomes a diagnosis of exclusion with panic attacks.  Patient does have a psychiatrist and will to the psychiatrist for any concerns.  Is also of note the patient has had a history of drug use and some of the symptoms are similar to withdrawal.  This will need to be discussed with patient and that she is on medication for alleviation of this concern.

## 2019-05-29 ENCOUNTER — Other Ambulatory Visit: Payer: Self-pay

## 2019-05-29 DIAGNOSIS — R55 Syncope and collapse: Secondary | ICD-10-CM

## 2019-05-29 LAB — CBC WITH DIFFERENTIAL/PLATELET
Basophils Absolute: 0 10*3/uL (ref 0.0–0.2)
Basos: 0 %
EOS (ABSOLUTE): 0 10*3/uL (ref 0.0–0.4)
Eos: 1 %
Hematocrit: 43.8 % (ref 34.0–46.6)
Hemoglobin: 14.2 g/dL (ref 11.1–15.9)
Immature Grans (Abs): 0 10*3/uL (ref 0.0–0.1)
Immature Granulocytes: 0 %
Lymphocytes Absolute: 1.5 10*3/uL (ref 0.7–3.1)
Lymphs: 27 %
MCH: 29.4 pg (ref 26.6–33.0)
MCHC: 32.4 g/dL (ref 31.5–35.7)
MCV: 91 fL (ref 79–97)
Monocytes Absolute: 0.5 10*3/uL (ref 0.1–0.9)
Monocytes: 8 %
Neutrophils Absolute: 3.6 10*3/uL (ref 1.4–7.0)
Neutrophils: 64 %
Platelets: 263 10*3/uL (ref 150–450)
RBC: 4.83 x10E6/uL (ref 3.77–5.28)
RDW: 13.6 % (ref 11.7–15.4)
WBC: 5.6 10*3/uL (ref 3.4–10.8)

## 2019-05-29 LAB — RENAL FUNCTION PANEL
Albumin: 4.6 g/dL (ref 3.8–4.8)
BUN/Creatinine Ratio: 16 (ref 9–23)
BUN: 14 mg/dL (ref 6–20)
CO2: 24 mmol/L (ref 20–29)
Calcium: 9.8 mg/dL (ref 8.7–10.2)
Chloride: 99 mmol/L (ref 96–106)
Creatinine, Ser: 0.85 mg/dL (ref 0.57–1.00)
GFR calc Af Amer: 102 mL/min/{1.73_m2} (ref >59)
GFR calc non Af Amer: 88 mL/min/{1.73_m2} (ref >59)
Glucose: 92 mg/dL (ref 65–99)
Phosphorus: 2.4 mg/dL — ABNORMAL LOW (ref 3.0–4.3)
Potassium: 3.7 mmol/L (ref 3.5–5.2)
Sodium: 137 mmol/L (ref 134–144)

## 2019-05-29 NOTE — Progress Notes (Unsigned)
Ref to neuro placed 

## 2019-06-11 DIAGNOSIS — R270 Ataxia, unspecified: Secondary | ICD-10-CM | POA: Insufficient documentation

## 2019-08-19 DIAGNOSIS — R569 Unspecified convulsions: Secondary | ICD-10-CM | POA: Insufficient documentation

## 2019-09-02 ENCOUNTER — Emergency Department
Admission: EM | Admit: 2019-09-02 | Discharge: 2019-09-02 | Disposition: A | Discharge status: Home / Self Care | Payer: No Typology Code available for payment source | Attending: Emergency Medicine | Admitting: Emergency Medicine

## 2019-09-02 ENCOUNTER — Other Ambulatory Visit: Payer: Self-pay

## 2019-09-02 DIAGNOSIS — F1010 Alcohol abuse, uncomplicated: Secondary | ICD-10-CM | POA: Insufficient documentation

## 2019-09-02 DIAGNOSIS — F17210 Nicotine dependence, cigarettes, uncomplicated: Secondary | ICD-10-CM | POA: Insufficient documentation

## 2019-09-02 DIAGNOSIS — F329 Major depressive disorder, single episode, unspecified: Secondary | ICD-10-CM | POA: Diagnosis present

## 2019-09-02 LAB — COMPREHENSIVE METABOLIC PANEL
ALT: 31 U/L (ref 0–44)
AST: 44 U/L — ABNORMAL HIGH (ref 15–41)
Albumin: 4.5 g/dL (ref 3.5–5.0)
Alkaline Phosphatase: 103 U/L (ref 38–126)
Anion gap: 12 (ref 5–15)
BUN: 11 mg/dL (ref 6–20)
CO2: 27 mmol/L (ref 22–32)
Calcium: 9.7 mg/dL (ref 8.9–10.3)
Chloride: 96 mmol/L — ABNORMAL LOW (ref 98–111)
Creatinine, Ser: 1 mg/dL (ref 0.44–1.00)
GFR calc Af Amer: >60 mL/min (ref >60)
GFR calc non Af Amer: >60 mL/min (ref >60)
Glucose, Bld: 117 mg/dL — ABNORMAL HIGH (ref 70–99)
Potassium: 3.2 mmol/L — ABNORMAL LOW (ref 3.5–5.1)
Sodium: 135 mmol/L (ref 135–145)
Total Bilirubin: 1.6 mg/dL — ABNORMAL HIGH (ref 0.3–1.2)
Total Protein: 8.1 g/dL (ref 6.5–8.1)

## 2019-09-02 LAB — URINE DRUG SCREEN, QUALITATIVE (ARMC ONLY)
Amphetamines, Ur Screen: NOT DETECTED
Barbiturates, Ur Screen: NOT DETECTED
Benzodiazepine, Ur Scrn: POSITIVE — AB
Cannabinoid 50 Ng, Ur ~~LOC~~: POSITIVE — AB
Cocaine Metabolite,Ur ~~LOC~~: NOT DETECTED
MDMA (Ecstasy)Ur Screen: NOT DETECTED
Methadone Scn, Ur: NOT DETECTED
Opiate, Ur Screen: NOT DETECTED
Phencyclidine (PCP) Ur S: NOT DETECTED
Tricyclic, Ur Screen: NOT DETECTED

## 2019-09-02 LAB — CBC
HCT: 45.5 % (ref 36.0–46.0)
Hemoglobin: 16.1 g/dL — ABNORMAL HIGH (ref 12.0–15.0)
MCH: 33.3 pg (ref 26.0–34.0)
MCHC: 35.4 g/dL (ref 30.0–36.0)
MCV: 94.2 fL (ref 80.0–100.0)
Platelets: 348 10*3/uL (ref 150–400)
RBC: 4.83 MIL/uL (ref 3.87–5.11)
RDW: 15 % (ref 11.5–15.5)
WBC: 8.6 10*3/uL (ref 4.0–10.5)
nRBC: 0 % (ref 0.0–0.2)

## 2019-09-02 LAB — ETHANOL: Alcohol, Ethyl (B): <10 mg/dL (ref <10)

## 2019-09-02 MED ORDER — LORAZEPAM 2 MG PO TABS
2.0000 mg | ORAL_TABLET | Freq: Once | ORAL | Status: DC
  Filled 2019-09-02: qty 1

## 2019-09-02 NOTE — ED Triage Notes (Signed)
When asked about SI pt states "I don't want to live this life anymore".  Discussed dressing out with pt.  Dr brown currently at triage room.
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
## 2019-09-02 NOTE — ED Notes (Signed)
@   2010, updated pt regarding arrival of TTS for evaluation and to provide detox resources. Pt stated "This couldn't have been done hour ago?"  @ 2027, Attempted to update pt regarding TTS arrival to speak with her. Pt was on personal phone. Stood next to her for 5 minutes and was thoroughly ignored by pt.  @ 2033 Pt speaking to CN. Attempted to update her and provide her w/ medication for anxiety and to help prevent ETOh withdrawal sxs. Pt refused to allow this RN to provide her w/ information. Pt stated that she wanted to leave and as she is voluntary, CN and security escorted her to waiting room. Pt angry that her detox treatment was not made more of a priority than she perceived.

## 2019-09-02 NOTE — ED Notes (Signed)
Per MD Darnelle Catalan and RN Erie Noe, pt safe to come back to [address] without being changed into behavorial clothing. Pt tearful stating that she only wants help to detox, she has been speaking with someone from an outside resource about getting her the help that she needs but they can't see her quick enough. MD Darnelle Catalan notified

## 2019-09-02 NOTE — ED Triage Notes (Signed)
PT to ED c/o alcholism and depression. Would like to get tx as she states it is affecting her life and her work. PT is daily drinker, has had "not even half a fifth" today.

## 2019-10-16 ENCOUNTER — Telehealth: Payer: Self-pay | Admitting: Family Medicine

## 2019-10-16 NOTE — Telephone Encounter (Unsigned)
Copied from CRM [id number]. Topic: General - Other >> Oct 16, 2019  9:30 AM Kimberly Hayes wrote: Patient would like to know if Dr. Yetta Barre in able to remove nexplanon from arm or if she would need to see her gynecologist.

## 2019-10-16 NOTE — Telephone Encounter (Signed)
The person or office that put it in will be the one to remove it

## 2019-10-16 NOTE — Telephone Encounter (Signed)
Spoke to pt

## 2019-10-29 DIAGNOSIS — Z653 Problems related to other legal circumstances: Secondary | ICD-10-CM

## 2019-10-30 ENCOUNTER — Ambulatory Visit (LOCAL_COMMUNITY_HEALTH_CENTER): Payer: No Typology Code available for payment source | Admitting: Physician Assistant

## 2019-10-30 ENCOUNTER — Encounter: Payer: Self-pay | Admitting: Physician Assistant

## 2019-10-30 ENCOUNTER — Ambulatory Visit: Payer: Self-pay

## 2019-10-30 ENCOUNTER — Other Ambulatory Visit: Payer: Self-pay

## 2019-10-30 VITALS — BP 121/79 | Ht 65.0 in | Wt 154.0 lb

## 2019-10-30 DIAGNOSIS — Z30017 Encounter for initial prescription of implantable subdermal contraceptive: Secondary | ICD-10-CM | POA: Diagnosis not present

## 2019-10-30 DIAGNOSIS — Z3009 Encounter for other general counseling and advice on contraception: Secondary | ICD-10-CM | POA: Diagnosis not present

## 2019-10-30 DIAGNOSIS — Z0000 Encounter for general adult medical examination without abnormal findings: Secondary | ICD-10-CM

## 2019-10-30 DIAGNOSIS — Z113 Encounter for screening for infections with a predominantly sexual mode of transmission: Secondary | ICD-10-CM

## 2019-10-30 DIAGNOSIS — Z3046 Encounter for surveillance of implantable subdermal contraceptive: Secondary | ICD-10-CM

## 2019-10-30 LAB — WET PREP FOR TRICH, YEAST, CLUE
Trichomonas Exam: NEGATIVE
Yeast Exam: NEGATIVE

## 2019-10-30 LAB — PREGNANCY, URINE: Preg Test, Ur: NEGATIVE

## 2019-10-30 MED ORDER — ETONOGESTREL 68 MG ~~LOC~~ IMPL
68.0000 mg | DRUG_IMPLANT | Freq: Once | SUBCUTANEOUS | Status: AC
  Administered 2019-10-30: 68 mg via SUBCUTANEOUS

## 2019-10-30 NOTE — Progress Notes (Signed)
Family Planning Visit- Repeat Yearly Visit  Subjective:  Kimberly Hayes is a 39 y.o. [phone]  being seen today for an well woman visit and to discuss family planning options.    She is currently using Nexplanon for pregnancy prevention. Patient reports she does not  want a pregnancy in the next year. Patient  has Lost custody of children; Ataxia; and Seizure-like activity (HCC) on their problem list.  Chief Complaint  Patient presents with  . Contraception  . Procedure    Nexplanon removal/reinsertion    Patient reports that she would like to have her Nexplanon removed and a new one placed.  Per chart review, pap is due in 2023.  Patient states that she went to rehab and has had Hep B and C testing there in March.  States that she is following up with PCP for anxiety and depression.  States that her weight fluctuates and she has recently gotten back to her normal weght.  Patient denies any concerns today.   See flowsheet for other program required questions.   Body mass index is 25.63 kg/m. - Patient is eligible for diabetes screening based on BMI and age >51?  not applicable HA1C ordered? not applicable  Patient reports 1 of partners in last year. Desires STI screening?  Yes   Has patient been screened once for HCV in the past?  Yes  No results found for: HCVAB  Does the patient have current of drug use, have a partner with drug use, and/or has been incarcerated since last result? No  If yes-- Screen for HCV through [facility]   Does the patient meet criteria for HBV testing? No  Criteria:  -Household, sexual or needle sharing contact with HBV -History of drug use -HIV positive -Those with known Hep C   Health Maintenance Due  Topic Date Due  . Hepatitis C Screening  Never done  . COVID-19 Vaccine (1) Never done  . INFLUENZA VACCINE  10/04/2019    Review of Systems  All other systems reviewed and are negative.   The following portions of the patient's  history were reviewed and updated as appropriate: allergies, current medications, past family history, past medical history, past social history, past surgical history and problem list. Problem list updated.  Objective:   Vitals:   10/30/19 1444  BP: 121/79  Weight: 154 lb (69.9 kg)  Height: 5\' 5"  (1.651 m)    Physical Exam Vitals and nursing note reviewed.  Constitutional:      General: She is not in acute distress.    Appearance: Normal appearance.  HENT:     Head: Normocephalic and atraumatic.  Eyes:     Conjunctiva/sclera: Conjunctivae normal.  Neck:     Thyroid: No thyroid mass, thyromegaly or thyroid tenderness.  Cardiovascular:     Rate and Rhythm: Normal rate and regular rhythm.  Pulmonary:     Effort: Pulmonary effort is normal.     Breath sounds: Normal breath sounds.  Chest:     Breasts:        Right: Normal. No mass, nipple discharge, skin change or tenderness.        Left: Normal. No mass, nipple discharge, skin change or tenderness.  Abdominal:     Palpations: Abdomen is soft. There is no mass.     Tenderness: There is no abdominal tenderness. There is no guarding or rebound.  Musculoskeletal:     Cervical back: Neck supple. No tenderness.  Lymphadenopathy:  Cervical: No cervical adenopathy.     Upper Body:     Right upper body: No supraclavicular, axillary or pectoral adenopathy.     Left upper body: No supraclavicular, axillary or pectoral adenopathy.  Skin:    General: Skin is warm and dry.     Findings: No bruising, lesion or rash.  Neurological:     Mental Status: She is alert and oriented to person, place, and time.  Psychiatric:        Mood and Affect: Mood normal.        Behavior: Behavior normal.        Thought Content: Thought content normal.        Judgment: Judgment normal.       Assessment and Plan:  Kimberly Hayes is a 39 y.o. female [phone] presenting to the [facility] for an yearly well woman  exam/family planning visit  Contraception counseling: Reviewed all forms of birth control options in the tiered based approach. available including abstinence; over the counter/barrier methods; hormonal contraceptive medication including pill, patch, ring, injection,contraceptive implant, ECP; hormonal and nonhormonal IUDs; permanent sterilization options including vasectomy and the various tubal sterilization modalities. Risks, benefits, and typical effectiveness rates were reviewed.  Questions were answered.  Written information was also given to the patient to review.  Patient desires Nexplanon removal and reinsertion, this was prescribed for patient. She will follow up in  1 year and prn for surveillance.  She was told to call with any further questions, or with any concerns about this method of contraception.  Emphasized use of condoms 100% of the time for STI prevention.  Patient declined ECP today.     1. Encounter for counseling regarding contraception Reviewed with patient SE of Nexplanon and when to call clinic for irregular bleeding. Rec condoms as back up for 10 days and check OTC pregnancy test in 2 weeks.  RTC if positive.  2. Screening for STD (sexually transmitted disease) Await test results.  Counseled that RN will call if needs to RTC for treatment once results are back.  - WET PREP FOR TRICH, YEAST, CLUE - Chlamydia/Gonorrhea [place] [facility] - HIV [place] [facility] - Syphilis Serology, [place] [facility]  3. Encounter for removal and reinsertion of Nexplanon Nexplanon Removal and Insertion  Patient identified, informed consent performed, consent signed.   Patient does understand that irregular bleeding is a very common side effect of this medication. She was advised to have backup contraception for one week after replacement of the implant. Patient deemed to meet WHO criteria for being reasonably certain she is not pregnant.  Appropriate time out taken. Nexplanon site identified. Area  prepped in usual sterile fashon. 2 ml of 1% lidocaine with epinephrine was used to anesthetize the area at the distal end of the implant. A small stab incision was made right beside the implant on the distal portion. The Nexplanon rod was grasped manually and removed without difficulty. There was minimal blood loss. There were no complications.   Confirmed correct location of insertion site. The insertion site was identified 8-10 cm (3-4 inches) from the medial epicondyle of the humerus and 3-5 cm (1.25-2 inches) posterior to (below) the sulcus (groove) between the biceps and triceps muscles of the patient's left arm. New Nexplanon removed from packaging, Device confirmed in needle, then inserted full length of needle and withdrawn per handbook instructions. Nexplanon was able to palpated in the patient's left arm; patient palpated the insert herself.  There was  minimal blood loss. Patient insertion site covered with guaze and a pressure bandage to reduce any bruising. The patient tolerated the procedure well and was given post procedure instructions.    Nexplanon:   Counseled patient to take OTC analgesic starting as soon as lidocaine starts to wear off and take regularly for at least 48 hr to decrease discomfort.  Specifically to take with food or milk to decrease stomach upset and for IB 600 mg (3 tablets) every 6 hrs; IB 800 mg (4 tablets) every 8 hrs; or Aleve 2 tablets every 12 hrs.   - Pregnancy, urine - etonogestrel (NEXPLANON) implant 68 mg  4. Well woman exam (no gynecological exam) Reviewed with patient healthy habits for maintaining normal BMI. Enc MVI 1 po daily. Enc to continue follow up with PCP for chronic conditions. Enc patient to continue with abstinence from substance use.     Return in about 1 year (around 10/29/2020) for RP and prn.  No future appointments.  Matt Holmes, PA

## 2019-10-30 NOTE — Progress Notes (Signed)
PT negative. [place] results reviewed. Per standing orders no treatment indicated. Nafisa Olds, RN  

## 2019-10-30 NOTE — Progress Notes (Signed)
Here today for a PE and Nexplanon removal/reinsertion. Last PE and Pap Smear here was 10/08/2016. Nexplanon placed here 10/12/2016. Wants all STD screening today. Tawny Hopping, RN

## 2019-11-20 ENCOUNTER — Encounter: Payer: Self-pay | Admitting: Emergency Medicine

## 2019-11-20 ENCOUNTER — Emergency Department
Admission: EM | Admit: 2019-11-20 | Discharge: 2019-11-20 | Disposition: A | Discharge status: Home / Self Care | Payer: No Typology Code available for payment source | Attending: Emergency Medicine | Admitting: Emergency Medicine

## 2019-11-20 ENCOUNTER — Other Ambulatory Visit: Payer: Self-pay

## 2019-11-20 DIAGNOSIS — F17290 Nicotine dependence, other tobacco product, uncomplicated: Secondary | ICD-10-CM | POA: Insufficient documentation

## 2019-11-20 DIAGNOSIS — L409 Psoriasis, unspecified: Secondary | ICD-10-CM | POA: Insufficient documentation

## 2019-11-20 MED ORDER — MUPIROCIN 2 % EX OINT: TOPICAL_OINTMENT | CUTANEOUS | 0 refills | Status: AC

## 2019-11-20 MED ORDER — TRIAMCINOLONE ACETONIDE 0.5 % EX OINT: 1.0000 "application " | TOPICAL_OINTMENT | Freq: Two times a day (BID) | CUTANEOUS | 0 refills | Status: DC

## 2019-11-20 NOTE — ED Triage Notes (Signed)
Pt reports rash to both lower legs appeared 2 weeks ago, denies changing soaps, detergent or being near any plants. Pt reports rash "burns" Lower leg warmed to touch localized to lower legs. Pt talks in complete sentences no distress noted.

## 2019-11-20 NOTE — Discharge Instructions (Signed)
Please follow up with dermatology or primary care.

## 2019-11-20 NOTE — ED Provider Notes (Signed)
[facility] Emergency Department Provider Note  ____________________________________________  Time seen: Approximately 11:01 PM  I have reviewed the triage vital signs and the nursing notes.   HISTORY  Chief Complaint Rash   HPI Kimberly Hayes is a 39 y.o. female presents to the emergency department for treatment and evaluation of rash to the lower legs.  Rash came up about 2 weeks ago and has been intermittent.  She states that today she felt like her legs were on fire and she pulled her pant leg and noticed that the inner aspects of both of her lower legs are very red and warm to touch.  Areas burn as well.  No alleviating measures attempted prior to arrival no previous history of skin infection similar to this.   Past Medical History:  Diagnosis Date  . Anxiety   . Depression   . 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 Seizure-like activity ([place]) 08/19/2019  . Ataxia 06/11/2019  . Lost custody of children 10/08/2016    Past Surgical History:  Procedure Laterality Date  . NO PAST SURGERIES      Prior to Admission medications   Medication Sig Start Date End Date Taking? Authorizing Provider  Buprenorphine HCl-Naloxone HCl 8-2 MG FILM DISSOLVE 1 FILM UNDER THE TONGUE TWO TIMES A DAY 06/17/17   [provider]  Etonogestrel (NEXPLANON [place]) Inject into the skin. 10/30/19 10/30/22  Jerene Dilling, PA  furosemide (LASIX) 20 MG tablet Take 20 mg by mouth daily. 09/26/19   [provider]  gabapentin (NEURONTIN) 100 MG capsule  08/25/19   [provider]  mupirocin ointment (BACTROBAN) 2 % Apply to affected area 3 times daily 11/20/19 11/19/20  Victorino Dike, FNP  NARCAN 4 MG/0.1ML LIQD nasal spray kit  04/30/18   [provider]  propranolol (INDERAL) 20 MG tablet Take 20 mg by mouth 3 (three) times daily. 10/05/19   [provider]   sertraline (ZOLOFT) 50 MG tablet Take 50 mg by mouth daily. Patient not taking: Reported on 09/03/2019 05/16/19   [provider]  triamcinolone ointment (KENALOG) 0.5 % Apply 1 application topically 2 (two) times daily. 11/20/19   Victorino Dike, FNP    Allergies Patient has no known allergies.  Family History  Problem Relation Age of Onset  . Autoimmune disease Mother   . Immunodeficiency Mother   . Hypertension Mother   . Depression Mother   . Anxiety disorder Mother   . Bipolar disorder Mother   . Thyroid disease Sister   . Cancer Maternal Grandmother   . Diabetes Maternal Grandmother   . Hypertension Half-Sister     Social History Social History   Tobacco Use  . Smoking status: Current Every Day Smoker    Years: 2.00    Types: E-cigarettes    Last attempt to quit: 08/17/2015    Years since quitting: 4.2  . Smokeless tobacco: Never Used  . Tobacco comment: smoking cessation discussed  Vaping Use  . Vaping Use: Every day  Substance Use Topics  . Alcohol use: Not Currently  . Drug use: Not Currently    Comment: recovering Heroin Addict     Review of Systems  Constitutional: Negative for fever. Respiratory: Negative for cough or shortness of breath.  Musculoskeletal: Negative for myalgias Skin: Positive for rash Neurological: Negative for numbness or paresthesias. ____________________________________________   PHYSICAL EXAM:  VITAL SIGNS: ED Triage Vitals  Enc Vitals Group     BP 11/20/19 2039 121/78     Pulse Rate 11/20/19 2039 82     Resp 11/20/19 2039 16     Temp 11/20/19 2039 98.8 F (37.1 C)     Temp Source 11/20/19 2039 Oral     SpO2 11/20/19 2039 99 %     Weight 11/20/19 2040 155 lb (70.3 kg)     Height 11/20/19 2040 '5\' 4"'  (1.626 m)     Head Circumference --      Peak Flow --      Pain Score 11/20/19 2044 7     Pain Loc --      Pain Edu? --      Excl. in [place]? --      Constitutional: Well appearing. Eyes: Conjunctivae are clear  without discharge or drainage. Nose: No rhinorrhea noted. Mouth/Throat: Airway is patent.  Neck: No stridor. Unrestricted range of motion observed. Cardiovascular: Capillary refill is <3 seconds.  Respiratory: Respirations are even and unlabored.. Musculoskeletal: Unrestricted range of motion observed. Neurologic: Awake, alert, and oriented x 4.  Skin: Areas on bilateral medial lower extremities just above medial malleolus with macular, erythematous areas that are very warm to touch.  ____________________________________________   LABS (all labs ordered are listed, but only abnormal results are displayed)  Labs Reviewed - No data to display ____________________________________________  EKG  Not indicated. ____________________________________________  RADIOLOGY  Not indicated ____________________________________________   PROCEDURES  Procedures ____________________________________________   INITIAL IMPRESSION / ASSESSMENT AND PLAN / ED COURSE  Kimberly Hayes is a 39 y.o. female presents to the emergency department for treatment and evaluation of sudden onset uncomfortable, erythematous rash to the lower legs that has been intermittent for the past 2 weeks.  See HPI for further details.  On exam, there is no plaque raised border, skin injury or clarifying property of the areas.  There are no vesicles or bullae.  There is no drainage.  Differential diagnosis would be cutaneous staph however that would not be intermittent.  Also considering  erythroderma psoriasis.  For now, she will be treated with mupirocin ointment and triamcinolone and be referred back to her primary care provider or dermatology.  Both numbers were provided.  She is to attempt to schedule a follow-up early this coming week.  Medications - No data to display   Pertinent labs & imaging results that were available during my care of the patient were reviewed by me and considered in my medical decision  making (see chart for details).  ____________________________________________   FINAL CLINICAL IMPRESSION(S) / ED DIAGNOSES  Final diagnoses:  Psoriasis    ED Discharge Orders         Ordered    mupirocin ointment (BACTROBAN) 2 %        11/20/19 2205    triamcinolone ointment (KENALOG) 0.5 %  2 times daily        11/20/19 2205           Note:  This document was prepared using Dragon voice recognition software and may include unintentional dictation errors.   Victorino Dike, FNP 11/20/19 2308    Vladimir Crofts, MD 11/20/19 2356

## 2019-11-20 NOTE — ED Notes (Signed)
LE rash x2 weeks.

## 2021-01-24 ENCOUNTER — Telehealth: Payer: Self-pay | Admitting: Family Medicine

## 2021-01-24 NOTE — Telephone Encounter (Signed)
Pt states that she has not had a period with Nexplanon in 5 years, however, she has had irregular bleeding the last few weeks.  Pt unable to describe if she is spotting or having heavy bleeding.  Pt states "it's hard to describe over the phone, that is why I asked for an appointment, but they took a message."  Informed pt that it is normal to have irregular bleeding while on Nexplanon, but she insist "that is not me."  Pt wanted to know if it is time to have her Nexplanon removed and I informed her that it was placed on 10/30/2019 and wouldn't need to be removed until 10/2022.  Pt encouraged to make an appointment and was transferred to the front desk.

## 2021-01-24 NOTE — Telephone Encounter (Signed)
She is bleeding in the area where her bc was placed she said its like weekly and it a lot so she wants to speak with the nurse

## 2021-02-03 ENCOUNTER — Ambulatory Visit: Payer: Self-pay

## 2021-04-05 ENCOUNTER — Encounter: Payer: Self-pay | Admitting: Family Medicine

## 2021-04-05 ENCOUNTER — Ambulatory Visit (INDEPENDENT_AMBULATORY_CARE_PROVIDER_SITE_OTHER): Payer: 59 | Admitting: Family Medicine

## 2021-04-05 ENCOUNTER — Other Ambulatory Visit: Payer: Self-pay

## 2021-04-05 VITALS — BP 120/80 | HR 64 | Ht 64.0 in | Wt 165.0 lb

## 2021-04-05 DIAGNOSIS — R198 Other specified symptoms and signs involving the digestive system and abdomen: Secondary | ICD-10-CM | POA: Diagnosis not present

## 2021-04-05 DIAGNOSIS — R5383 Other fatigue: Secondary | ICD-10-CM | POA: Diagnosis not present

## 2021-04-05 DIAGNOSIS — R569 Unspecified convulsions: Secondary | ICD-10-CM | POA: Diagnosis not present

## 2021-04-05 NOTE — Progress Notes (Signed)
° ° °Date:  04/05/2021  ° °Name:  Kimberly Hayes   DOB:  01/08/1981   MRN:  [id number] ° ° °Chief Complaint: bowel issues (Stools look "like they have snot in them" x 2 months. Sometimes normal consistency, other times not) ° °Diarrhea  °This is a new (mucus in bm) problem. The current episode started more than 1 month ago (3 months). Progression since onset: intermitant. The stool consistency is described as Mucous. The patient states that diarrhea does not awaken her from sleep. Associated symptoms include weight loss. Pertinent negatives include no abdominal pain, bloating, fever, headaches or vomiting. Exacerbated by: not certain. She has tried nothing for the symptoms.  °Seizures  °This is a new problem. Associated symptoms include diarrhea. Pertinent negatives include no headaches, no nausea and no vomiting. Characteristics include bit tongue. grinding teeth/awoken on floor/bruising noted  ° °Lab Results  °Component Value Date  ° NA 135 09/02/2019  ° K 3.2 (L) 09/02/2019  ° CO2 27 09/02/2019  ° GLUCOSE 117 (H) 09/02/2019  ° BUN 11 09/02/2019  ° CREATININE 1.00 09/02/2019  ° CALCIUM 9.7 09/02/2019  ° GFRNONAA >60 09/02/2019  ° °No results found for: CHOL, HDL, LDLCALC, LDLDIRECT, TRIG, CHOLHDL °Lab Results  °Component Value Date  ° TSH 1.170 05/14/2018  ° °No results found for: HGBA1C °Lab Results  °Component Value Date  ° WBC 8.6 09/02/2019  ° HGB 16.1 (H) 09/02/2019  ° HCT 45.5 09/02/2019  ° MCV 94.2 09/02/2019  ° PLT 348 09/02/2019  ° °Lab Results  °Component Value Date  ° ALT 31 09/02/2019  ° AST 44 (H) 09/02/2019  ° ALKPHOS 103 09/02/2019  ° BILITOT 1.6 (H) 09/02/2019  ° °No results found for: 25OHVITD2, 25OHVITD3, VD25OH  ° °Review of Systems  °Constitutional:  Positive for fatigue and weight loss. Negative for appetite change, diaphoresis, fever and unexpected weight change.  °HENT:  Negative for nosebleeds.   °Respiratory:  Negative for wheezing.   °Gastrointestinal:  Positive for diarrhea. Negative for  abdominal distention, abdominal pain, anal bleeding, bloating, blood in stool, nausea, rectal pain and vomiting.  °Neurological:  Positive for seizures. Negative for headaches.  ° °Patient Active Problem List  ° Diagnosis Date Noted  ° Seizure-like activity (HCC) 08/19/2019  ° Ataxia 06/11/2019  ° Lost custody of children 10/08/2016  ° ° °No Known Allergies ° °Past Surgical History:  °Procedure Laterality Date  ° NO PAST SURGERIES    ° ° °Social History  ° °Tobacco Use  ° Smoking status: Every Day  °  Types: E-cigarettes  °  Last attempt to quit: 08/17/2015  °  Years since quitting: 5.6  ° Smokeless tobacco: Never  ° Tobacco comments:  °  smoking cessation discussed  °Vaping Use  ° Vaping Use: Every day  °Substance Use Topics  ° Alcohol use: Not Currently  ° Drug use: Not Currently  °  Comment: recovering Heroin Addict   ° ° ° °Medication list has been reviewed and updated. ° °Current Meds  °Medication Sig  ° Buprenorphine HCl-Naloxone HCl 8-2 MG FILM DISSOLVE 1 FILM UNDER THE TONGUE TWO TIMES A DAY  ° Etonogestrel (NEXPLANON [place]) Inject into the skin.  ° gabapentin (NEURONTIN) 100 MG capsule   ° NARCAN 4 MG/0.1ML LIQD nasal spray kit   ° sertraline (ZOLOFT) 50 MG tablet Take 50 mg by mouth daily.  ° [DISCONTINUED] furosemide (LASIX) 20 MG tablet Take 20 mg by mouth daily.  ° [DISCONTINUED] triamcinolone ointment (KENALOG) 0.5 % Apply 1   application topically 2 (two) times daily.  ° ° °PHQ 2/9 Scores 04/05/2021 10/30/2019 05/27/2019 02/12/2019  °PHQ - 2 Score 6 0 5 4  °PHQ- 9 Score 17 - 12 12  ° ° °GAD 7 : Generalized Anxiety Score 04/05/2021 05/27/2019 02/12/2019  °Nervous, Anxious, on Edge 2 2 1  °Control/stop worrying 2 1 1  °Worry too much - different things 3 1 2  °Trouble relaxing 2 2 2  °Restless 2 2 0  °Easily annoyed or irritable 2 3 2  °Afraid - awful might happen 2 1 0  °Total GAD 7 Score 15 12 8  °Anxiety Difficulty Somewhat difficult Extremely difficult Very difficult  ° ° °BP Readings from Last 3 Encounters:   °04/05/21 120/80  °11/20/19 121/78  °10/30/19 121/79  ° ° °Physical Exam °Vitals and nursing note reviewed.  °Constitutional:   °   Appearance: She is well-developed.  °HENT:  °   Head: Normocephalic.  °   Right Ear: Tympanic membrane and external ear normal.  °   Left Ear: Tympanic membrane and external ear normal.  °   Nose: Nose normal.  °Eyes:  °   General: Lids are everted, no foreign bodies appreciated. No scleral icterus.    °   Left eye: No foreign body or hordeolum.  °   Conjunctiva/sclera: Conjunctivae normal.  °   Right eye: Right conjunctiva is not injected.  °   Left eye: Left conjunctiva is not injected.  °   Pupils: Pupils are equal, round, and reactive to light.  °Neck:  °   Thyroid: No thyromegaly.  °   Vascular: No JVD.  °   Trachea: No tracheal deviation.  °Cardiovascular:  °   Rate and Rhythm: Normal rate and regular rhythm.  °   Heart sounds: Normal heart sounds. No murmur heard. °  No friction rub. No gallop.  °Pulmonary:  °   Effort: Pulmonary effort is normal. No respiratory distress.  °   Breath sounds: Normal breath sounds. No wheezing, rhonchi or rales.  °Abdominal:  °   General: Bowel sounds are normal.  °   Palpations: Abdomen is soft. There is no mass.  °   Tenderness: There is no abdominal tenderness. There is no guarding or rebound.  °Musculoskeletal:     °   General: No tenderness. Normal range of motion.  °   Cervical back: Normal range of motion and neck supple.  °Lymphadenopathy:  °   Cervical: No cervical adenopathy.  °Skin: °   General: Skin is warm.  °   Findings: No rash.  °Neurological:  °   Mental Status: She is alert and oriented to person, place, and time.  °   Cranial Nerves: Cranial nerves 2-12 are intact. No cranial nerve deficit.  °   Sensory: Sensation is intact.  °   Motor: Seizure activity present. No weakness.  °   Deep Tendon Reflexes: Reflexes normal.  °   Reflex Scores: °     Tricep reflexes are 3+ on the right side and 3+ on the left side. °     Bicep reflexes  are 3+ on the right side and 3+ on the left side. °     Brachioradialis reflexes are 3+ on the right side and 3+ on the left side. °     Patellar reflexes are 3+ on the right side and 3+ on the left side. °Psychiatric:     °   Mood and Affect: Mood is   is not anxious or depressed.    Wt Readings from Last 3 Encounters:  04/05/21 165 lb (74.8 kg)  11/20/19 155 lb (70.3 kg)  10/30/19 154 lb (69.9 kg)    BP 120/80    Pulse 64    Ht 5' 4" (1.626 m)    Wt 165 lb (74.8 kg)    BMI 28.32 kg/m   Assessment and Plan:  1. Change in bowel movement New onset.  Persistent.  Patient has experience persistent change in her bowel movements consisting with increased mucus or mucus surrounding stool.  There is no hematochezia or abdominal pain associated.  We will refer to gastroenterology for evaluation for possible gluten enteropathy versus IBS with mucus sequelae versus colitis. - Ambulatory referral to Gastroenterology  2. Fatigue, unspecified type Increased fatigue over the past several months we will check CBC TSH and renal function panel. - CBC with Differential/Platelet - TSH - Renal Function Panel  3. Seizure-like activity ([place]) Patient is awoken on the floor after a syncopal episode it sounds like which is noted to have a sore tongue and plus minus the presence of grinding of the teeth.  Patient does have no witnesses and does not recollect anything to suggest seizures but we will refer to neurology for reevaluation for this possibility.

## 2021-04-06 ENCOUNTER — Telehealth: Payer: Self-pay

## 2021-04-06 ENCOUNTER — Encounter: Payer: Self-pay | Admitting: Family Medicine

## 2021-04-06 DIAGNOSIS — F17200 Nicotine dependence, unspecified, uncomplicated: Secondary | ICD-10-CM | POA: Diagnosis not present

## 2021-04-06 DIAGNOSIS — F1290 Cannabis use, unspecified, uncomplicated: Secondary | ICD-10-CM | POA: Diagnosis not present

## 2021-04-06 DIAGNOSIS — R569 Unspecified convulsions: Secondary | ICD-10-CM | POA: Diagnosis not present

## 2021-04-06 DIAGNOSIS — R69 Illness, unspecified: Secondary | ICD-10-CM | POA: Diagnosis not present

## 2021-04-06 DIAGNOSIS — R413 Other amnesia: Secondary | ICD-10-CM | POA: Diagnosis not present

## 2021-04-06 DIAGNOSIS — F1010 Alcohol abuse, uncomplicated: Secondary | ICD-10-CM | POA: Diagnosis not present

## 2021-04-06 DIAGNOSIS — K148 Other diseases of tongue: Secondary | ICD-10-CM | POA: Diagnosis not present

## 2021-04-06 DIAGNOSIS — R42 Dizziness and giddiness: Secondary | ICD-10-CM | POA: Diagnosis not present

## 2021-04-06 LAB — CBC WITH DIFFERENTIAL/PLATELET
Basophils Absolute: 0.1 10*3/uL (ref 0.0–0.2)
Basos: 1 %
EOS (ABSOLUTE): 0.1 10*3/uL (ref 0.0–0.4)
Eos: 1 %
Hematocrit: 36.8 % (ref 34.0–46.6)
Hemoglobin: 12.1 g/dL (ref 11.1–15.9)
Immature Grans (Abs): 0 10*3/uL (ref 0.0–0.1)
Immature Granulocytes: 0 %
Lymphocytes Absolute: 1.8 10*3/uL (ref 0.7–3.1)
Lymphs: 18 %
MCH: 28.1 pg (ref 26.6–33.0)
MCHC: 32.9 g/dL (ref 31.5–35.7)
MCV: 86 fL (ref 79–97)
Monocytes Absolute: 0.5 10*3/uL (ref 0.1–0.9)
Monocytes: 5 %
Neutrophils Absolute: 7.6 10*3/uL — ABNORMAL HIGH (ref 1.4–7.0)
Neutrophils: 75 %
Platelets: 302 10*3/uL (ref 150–450)
RBC: 4.3 x10E6/uL (ref 3.77–5.28)
RDW: 14.4 % (ref 11.7–15.4)
WBC: 10.1 10*3/uL (ref 3.4–10.8)

## 2021-04-06 LAB — RENAL FUNCTION PANEL
Albumin: 4.1 g/dL (ref 3.8–4.8)
BUN/Creatinine Ratio: 21 (ref 9–23)
BUN: 15 mg/dL (ref 6–20)
CO2: 24 mmol/L (ref 20–29)
Calcium: 9.2 mg/dL (ref 8.7–10.2)
Chloride: 104 mmol/L (ref 96–106)
Creatinine, Ser: 0.73 mg/dL (ref 0.57–1.00)
Glucose: 88 mg/dL (ref 70–99)
Phosphorus: 3 mg/dL (ref 3.0–4.3)
Potassium: 4.5 mmol/L (ref 3.5–5.2)
Sodium: 140 mmol/L (ref 134–144)
eGFR: 108 mL/min/{1.73_m2} (ref >59)

## 2021-04-06 LAB — TSH: TSH: 0.467 u[IU]/mL (ref 0.450–4.500)

## 2021-04-06 NOTE — Telephone Encounter (Signed)
Scheduled for 05/29/2021 °

## 2021-04-10 ENCOUNTER — Other Ambulatory Visit: Payer: Self-pay | Admitting: Physician Assistant

## 2021-04-10 DIAGNOSIS — R569 Unspecified convulsions: Secondary | ICD-10-CM

## 2021-04-14 DIAGNOSIS — R569 Unspecified convulsions: Secondary | ICD-10-CM | POA: Diagnosis not present

## 2021-04-18 ENCOUNTER — Inpatient Hospital Stay: Admission: RE | Admit: 2021-04-18 | Payer: Self-pay | Source: Ambulatory Visit

## 2021-04-25 DIAGNOSIS — R569 Unspecified convulsions: Secondary | ICD-10-CM | POA: Diagnosis not present

## 2021-05-04 DIAGNOSIS — R69 Illness, unspecified: Secondary | ICD-10-CM | POA: Diagnosis not present

## 2021-05-04 DIAGNOSIS — F1290 Cannabis use, unspecified, uncomplicated: Secondary | ICD-10-CM | POA: Diagnosis not present

## 2021-05-04 DIAGNOSIS — F17200 Nicotine dependence, unspecified, uncomplicated: Secondary | ICD-10-CM | POA: Diagnosis not present

## 2021-05-04 DIAGNOSIS — F1010 Alcohol abuse, uncomplicated: Secondary | ICD-10-CM | POA: Diagnosis not present

## 2021-05-08 DIAGNOSIS — R69 Illness, unspecified: Secondary | ICD-10-CM | POA: Diagnosis not present

## 2021-05-08 DIAGNOSIS — R569 Unspecified convulsions: Secondary | ICD-10-CM | POA: Diagnosis not present

## 2021-05-25 ENCOUNTER — Ambulatory Visit
Admission: RE | Admit: 2021-05-25 | Discharge: 2021-05-25 | Disposition: A | Discharge status: Home / Self Care | Payer: 59 | Source: Ambulatory Visit | Attending: Physician Assistant | Admitting: Physician Assistant

## 2021-05-25 DIAGNOSIS — R569 Unspecified convulsions: Secondary | ICD-10-CM | POA: Diagnosis not present

## 2021-05-25 DIAGNOSIS — J392 Other diseases of pharynx: Secondary | ICD-10-CM | POA: Diagnosis not present

## 2021-05-25 DIAGNOSIS — R410 Disorientation, unspecified: Secondary | ICD-10-CM | POA: Diagnosis not present

## 2021-05-25 MED ORDER — GADOBENATE DIMEGLUMINE 529 MG/ML IV SOLN
15.0000 mL | Freq: Once | INTRAVENOUS | Status: AC | PRN
  Administered 2021-05-25: 15 mL via INTRAVENOUS

## 2021-05-29 ENCOUNTER — Ambulatory Visit: Payer: Self-pay | Admitting: Gastroenterology

## 2021-05-29 NOTE — Progress Notes (Deleted)
? ? ?Gastroenterology Consultation ? ?Referring Provider:     Juline Patch, MD ?Primary Care Physician:  Juline Patch, MD ?Primary Gastroenterologist:  Dr. Allen Norris     ?Reason for Consultation:     Change in bowel habits ?      ? HPI:   ?Kimberly Hayes is a 41 y.o. y/o female referred for consultation & management of change in bowel habit by Dr. Juline Patch, MD. This patient comes in to see me today after being seen in [facility] back in 2019 for GI symptoms.  At that time the patient was having right lower quadrant pain and chronic constipation.  She also had an unremarkable ultrasound and a KUB showing a large amount of stool.  At that time the patient was reporting that there was a reduction in the pain after having a bowel movement.  At that time the patient was reporting that she was previously over 200 pounds and had come in at about 154 lbs on the day of the visit.  The patient had hepatitis B surface antibody positive with hepatitis A antibody positive on past labs.  Due to the unintentional weight loss when she was seen by her previous gastroenterologist and the report that she had a family history of colon cancer the patient was recommended to undergo a colonoscopy with consideration for an upper endoscopy if the weight loss did not improve. ? ? ?Past Medical History:  ?Diagnosis Date  ? Anxiety   ? Depression   ? Opiate addiction ([place]) 10/04/2014  ? Heroin and Opiate   ? ? ?Past Surgical History:  ?Procedure Laterality Date  ? NO PAST SURGERIES    ? ? ?Prior to Admission medications   ?Medication Sig Start Date End Date Taking? Authorizing Provider  ?Buprenorphine HCl-Naloxone HCl 8-2 MG FILM DISSOLVE 1 FILM UNDER THE TONGUE TWO TIMES A DAY 06/17/17   [provider]  ?Etonogestrel (NEXPLANON [place]) Inject into the skin. 10/30/19 10/30/22  Jerene Dilling, PA  ?gabapentin (NEURONTIN) 100 MG capsule  08/25/19   [provider]  ?NARCAN 4 MG/0.1ML LIQD nasal spray kit  04/30/18    [provider]  ?sertraline (ZOLOFT) 50 MG tablet Take 50 mg by mouth daily. 05/16/19   [provider]  ? ? ?Family History  ?Problem Relation Age of Onset  ? Autoimmune disease Mother   ? Immunodeficiency Mother   ? Hypertension Mother   ? Depression Mother   ? Anxiety disorder Mother   ? Bipolar disorder Mother   ? Thyroid disease Sister   ? Cancer Maternal Grandmother   ? Diabetes Maternal Grandmother   ? Hypertension Half-Sister   ?  ? ?Social History  ? ?Tobacco Use  ? Smoking status: Every Day  ?  Types: E-cigarettes  ?  Last attempt to quit: 08/17/2015  ?  Years since quitting: 5.7  ? Smokeless tobacco: Never  ? Tobacco comments:  ?  smoking cessation discussed  ?Vaping Use  ? Vaping Use: Every day  ?Substance Use Topics  ? Alcohol use: Not Currently  ? Drug use: Not Currently  ?  Comment: recovering Heroin Addict   ? ? ?Allergies as of 05/29/2021  ? (No Known Allergies)  ? ? ?Review of Systems:    ?All systems reviewed and negative except where noted in HPI. ? ? Physical Exam:  ?There were no vitals taken for this visit. ?No LMP recorded. Patient has had an implant. ?General:   Alert,  Well-developed, well-nourished, pleasant and cooperative in NAD ?Head:  Normocephalic and atraumatic. ?Eyes:  Sclera clear, no icterus.   Conjunctiva pink. ?Ears:  Normal auditory acuity. ?Neck:  Supple; no masses or thyromegaly. ?Lungs:  Respirations even and unlabored.  Clear throughout to auscultation.   No wheezes, crackles, or rhonchi. No acute distress. ?Heart:  Regular rate and rhythm; no murmurs, clicks, rubs, or gallops. ?Abdomen:  Normal bowel sounds.  No bruits.  Soft, non-tender and non-distended without masses, hepatosplenomegaly or hernias noted.  No guarding or rebound tenderness.  Negative Carnett sign.   ?Rectal:  Deferred.  ?Pulses:  Normal pulses noted. ?Extremities:  No clubbing or edema.  No cyanosis. ?Neurologic:  Alert and oriented x3;  grossly normal neurologically. ?Skin:  Intact  without significant lesions or rashes.  No jaundice. ?Lymph Nodes:  No significant cervical adenopathy. ?Psych:  Alert and cooperative. Normal mood and affect. ? ?Imaging Studies: ?MR BRAIN W WO CONTRAST ? ?Result Date: 05/25/2021 ?CLINICAL DATA:  Provided history: Seizure-like activity. Additional history provided by scanning technologist: Episodes of confusion, memory loss. EXAM: MRI HEAD WITHOUT AND WITH CONTRAST TECHNIQUE: Multiplanar, multiecho pulse sequences of the brain and surrounding structures were obtained without and with intravenous contrast. CONTRAST:  22m MULTIHANCE GADOBENATE DIMEGLUMINE 529 MG/ML IV SOLN COMPARISON:  Head CT 05/01/2011. FINDINGS: Brain: Cerebral volume is normal. No cortical encephalomalacia is identified. No significant cerebral white matter disease. The hippocampi are symmetric in size and signal. There is no acute infarct. No evidence of an intracranial mass. No chronic intracranial blood products. No extra-axial fluid collection. No midline shift. No pathologic intracranial enhancement identified. Vascular: Maintained flow voids within the proximal large arterial vessels. Skull and upper cervical spine: No focal suspicious marrow lesion. Sinuses/Orbits: Visualized orbits show no acute finding. No significant paranasal sinus disease. 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 Small-volume fluid within the right mastoid air cells. IMPRESSION: Unremarkable MRI appearance of the brain. No evidence of acute intracranial abnormality. No specific seizure focus is identified. Small-volume fluid within the right mastoid air cells. Electronically Signed   By: KKellie SimmeringD.O.   On: 05/25/2021 18:06   ? ?Assessment and Plan:  ? ?Kimberly RISDENis a 41y.o. y/o female *** ? ? ? ?DLucilla Lame MD. FMarval Regal? ? ? Note: This dictation was prepared with Dragon dictation along with smaller phrase technology. Any transcriptional errors that result from this process  are unintentional.   ?

## 2021-06-30 DIAGNOSIS — R569 Unspecified convulsions: Secondary | ICD-10-CM | POA: Diagnosis not present

## 2021-07-01 DIAGNOSIS — R569 Unspecified convulsions: Secondary | ICD-10-CM | POA: Diagnosis not present

## 2021-07-02 DIAGNOSIS — F1290 Cannabis use, unspecified, uncomplicated: Secondary | ICD-10-CM | POA: Diagnosis not present

## 2021-07-02 DIAGNOSIS — F17200 Nicotine dependence, unspecified, uncomplicated: Secondary | ICD-10-CM | POA: Diagnosis not present

## 2021-07-02 DIAGNOSIS — F1010 Alcohol abuse, uncomplicated: Secondary | ICD-10-CM | POA: Diagnosis not present

## 2021-07-02 DIAGNOSIS — R569 Unspecified convulsions: Secondary | ICD-10-CM | POA: Diagnosis not present

## 2021-07-02 DIAGNOSIS — R69 Illness, unspecified: Secondary | ICD-10-CM | POA: Diagnosis not present

## 2021-07-20 DIAGNOSIS — R569 Unspecified convulsions: Secondary | ICD-10-CM | POA: Diagnosis not present

## 2021-07-20 DIAGNOSIS — R69 Illness, unspecified: Secondary | ICD-10-CM | POA: Diagnosis not present

## 2021-08-02 DIAGNOSIS — R69 Illness, unspecified: Secondary | ICD-10-CM | POA: Diagnosis not present

## 2021-08-02 DIAGNOSIS — F1290 Cannabis use, unspecified, uncomplicated: Secondary | ICD-10-CM | POA: Diagnosis not present

## 2021-08-02 DIAGNOSIS — F1010 Alcohol abuse, uncomplicated: Secondary | ICD-10-CM | POA: Diagnosis not present

## 2021-08-02 DIAGNOSIS — F17200 Nicotine dependence, unspecified, uncomplicated: Secondary | ICD-10-CM | POA: Diagnosis not present

## 2021-08-11 ENCOUNTER — Ambulatory Visit (LOCAL_COMMUNITY_HEALTH_CENTER): Payer: 59 | Admitting: Family Medicine

## 2021-08-11 ENCOUNTER — Encounter: Payer: Self-pay | Admitting: Family Medicine

## 2021-08-11 VITALS — BP 119/78 | Ht 65.0 in | Wt 159.9 lb

## 2021-08-11 DIAGNOSIS — Z3009 Encounter for other general counseling and advice on contraception: Secondary | ICD-10-CM

## 2021-08-11 DIAGNOSIS — Z3046 Encounter for surveillance of implantable subdermal contraceptive: Secondary | ICD-10-CM

## 2021-08-11 DIAGNOSIS — Z0000 Encounter for general adult medical examination without abnormal findings: Secondary | ICD-10-CM

## 2021-08-11 DIAGNOSIS — Z113 Encounter for screening for infections with a predominantly sexual mode of transmission: Secondary | ICD-10-CM

## 2021-08-11 LAB — WET PREP FOR TRICH, YEAST, CLUE
Trichomonas Exam: NEGATIVE
Yeast Exam: NEGATIVE

## 2021-08-11 LAB — HM HIV SCREENING LAB: HM HIV Screening: NEGATIVE

## 2021-08-11 NOTE — Patient Instructions (Signed)
Pt to take Ibuprofen 800 mg every 8 hours x 5 days

## 2021-08-11 NOTE — Progress Notes (Unsigned)
[facility] Number: [phone]  Family Planning Visit- Repeat Yearly Visit  Subjective:  Kimberly Hayes is a 41 y.o. [id number]  being seen today for an annual wellness visit and to discuss contraception options.   The patient is currently using Hormonal Implant for pregnancy prevention. Patient does not want a pregnancy in the next year.    report they are looking for a method that provides Other happy with the current method.    Patient has the following medical problems: has Lost custody of children; Ataxia; and Seizure-like activity ([place]) on their problem list.  Chief Complaint  Patient presents with   Annual Exam   Contraception    Patient reports here for physical, STI testing and questions about bleeding with nexplanon  Patient denies any other concerns other than bleeding.     See flowsheet for other program required questions.   Body mass index is 26.61 kg/m. - Patient is eligible for diabetes screening based on BMI and age [id number]?  not applicable [id number] ordered? not applicable  Patient reports 1 of partners in last year. Desires STI screening?  Yes   Has patient been screened once for HCV in the past?  {yes/no:20286}  No results found for: "HCVAB"  Does the patient have current of drug use, have a partner with drug use, and/or has been incarcerated since last result? No  If yes-- Screen for HCV through [facility]   Does the patient meet criteria for HBV testing? No  Criteria:  -Household, sexual or needle sharing contact with HBV -History of drug use -HIV positive -Those with known Hep C   Health Maintenance Due  Topic Date Due   COVID-19 Vaccine (1) Never done   Hepatitis C Screening  Never done   PAP SMEAR-Modifier  06/24/2021    Review of Systems  Constitutional:  Negative for chills, fever, malaise/fatigue and weight loss.  HENT:  Negative for congestion, hearing loss  and sore throat.   Eyes:  Negative for blurred vision, double vision and photophobia.  Respiratory:  Negative for shortness of breath.   Cardiovascular:  Negative for chest pain.  Gastrointestinal:  Negative for abdominal pain, blood in stool, constipation, diarrhea, heartburn, nausea and vomiting.  Genitourinary:  Negative for dysuria and frequency.  Musculoskeletal:  Negative for back pain, joint pain and neck pain.  Skin:  Negative for itching and rash.  Neurological:  Positive for dizziness. Negative for weakness and headaches.  Endo/Heme/Allergies:  Does not bruise/bleed easily.  Psychiatric/Behavioral:  Negative for depression, substance abuse and suicidal ideas.     The following portions of the patient's history were reviewed and updated as appropriate: allergies, current medications, past family history, past medical history, past social history, past surgical history and problem list. Problem list updated.  Objective:   Vitals:   08/11/21 1416  BP: 119/78  Weight: 159 lb 14.4 oz (72.5 kg)  Height: 5\' 5"  (1.651 m)    Physical Exam    Assessment and Plan:  Kimberly Hayes is a 41 y.o. female [phone] presenting to the [facility] Department for an yearly wellness and contraception visit   Contraception counseling: Reviewed options based on patient desire and reproductive life plan. Patient is interested in Hormonal Implant. This {WAS/WAS NOT:[phone]::"was not"} provided to the patient today.   Risks, benefits, and typical effectiveness rates were reviewed.  Questions were answered.  Written information was also given to the patient to  review.    The patient will follow up in  {NUMBER 1-10:22536} {days/wks/mos/yrs:310907} for surveillance.  The patient was told to call with any further questions, or with any concerns about this method of contraception.  Emphasized use of condoms 100% of the time for STI prevention.  Patient was assessed for need for ECP.  Patient was offered ECP based on {unprotected sex categories:26659}.  Patient is within {NUMBER 1-10:22536} days of unprotected sex. Patient was offered ECP. Reviewed options and patient desired {ECP options:27263}    1. Routine general medical examination at a health care facility Well woman exam  Pap not due until 2025  CBE due 2024   2. Screening examination for venereal disease  - HIV [place] [facility] - Syphilis Serology, [place] [facility] - WET PREP FOR [place], YEAST, [facility]  3. Encounter for surveillance of implantable subdermal contraceptive  Discussed breakthrough bleeding Pt to take Ibuprofen 800 mg every 8 hours x 5 days  Nexplanon due removal 2024     No follow-ups on file.  No future appointments.  Junious Dresser, FNP

## 2021-09-01 DIAGNOSIS — F1120 Opioid dependence, uncomplicated: Secondary | ICD-10-CM | POA: Diagnosis not present

## 2021-09-01 DIAGNOSIS — R69 Illness, unspecified: Secondary | ICD-10-CM | POA: Diagnosis not present

## 2021-09-01 DIAGNOSIS — F32A Depression, unspecified: Secondary | ICD-10-CM | POA: Diagnosis not present

## 2021-10-02 DIAGNOSIS — R69 Illness, unspecified: Secondary | ICD-10-CM | POA: Diagnosis not present

## 2021-11-02 DIAGNOSIS — R69 Illness, unspecified: Secondary | ICD-10-CM | POA: Diagnosis not present

## 2021-11-17 DIAGNOSIS — R918 Other nonspecific abnormal finding of lung field: Secondary | ICD-10-CM | POA: Diagnosis not present

## 2021-11-17 DIAGNOSIS — R4182 Altered mental status, unspecified: Secondary | ICD-10-CM | POA: Diagnosis not present

## 2021-11-17 DIAGNOSIS — J690 Pneumonitis due to inhalation of food and vomit: Secondary | ICD-10-CM | POA: Diagnosis not present

## 2021-11-17 DIAGNOSIS — R569 Unspecified convulsions: Secondary | ICD-10-CM | POA: Diagnosis not present

## 2021-11-17 DIAGNOSIS — Z20822 Contact with and (suspected) exposure to covid-19: Secondary | ICD-10-CM | POA: Diagnosis not present

## 2021-11-17 DIAGNOSIS — G40909 Epilepsy, unspecified, not intractable, without status epilepticus: Secondary | ICD-10-CM | POA: Diagnosis not present

## 2021-12-02 DIAGNOSIS — F32A Depression, unspecified: Secondary | ICD-10-CM | POA: Diagnosis not present

## 2021-12-02 DIAGNOSIS — F419 Anxiety disorder, unspecified: Secondary | ICD-10-CM | POA: Diagnosis not present

## 2021-12-02 DIAGNOSIS — R69 Illness, unspecified: Secondary | ICD-10-CM | POA: Diagnosis not present

## 2022-01-02 DIAGNOSIS — R69 Illness, unspecified: Secondary | ICD-10-CM | POA: Diagnosis not present

## 2022-02-13 ENCOUNTER — Ambulatory Visit: Payer: 59 | Admitting: Family Medicine

## 2022-02-13 ENCOUNTER — Ambulatory Visit: Payer: 59

## 2022-02-13 DIAGNOSIS — F1911 Other psychoactive substance abuse, in remission: Secondary | ICD-10-CM | POA: Insufficient documentation

## 2022-02-13 DIAGNOSIS — F419 Anxiety disorder, unspecified: Secondary | ICD-10-CM | POA: Insufficient documentation

## 2022-02-13 DIAGNOSIS — K219 Gastro-esophageal reflux disease without esophagitis: Secondary | ICD-10-CM | POA: Insufficient documentation

## 2022-02-13 DIAGNOSIS — F909 Attention-deficit hyperactivity disorder, unspecified type: Secondary | ICD-10-CM | POA: Insufficient documentation

## 2022-02-22 ENCOUNTER — Ambulatory Visit: Payer: 59

## 2022-03-04 DIAGNOSIS — R69 Illness, unspecified: Secondary | ICD-10-CM | POA: Diagnosis not present

## 2022-03-12 NOTE — Progress Notes (Signed)
03-12-2022 Received a request for medical records from [facility] Insurance[employer] for patient  Kimberly Hayes, DOB=04-19-1980, for medical records dated 03-05-2021 to present. Per [employer] letter, medical records are requested for the purpose of annual risk adjustment data collection. Copies of medical records released include: 08-11-2021 [facility] office visit notes,08-11-2021 test results for HIV, RPR, GC/Chlamydia and wet prep. The above medical records were mailed to [address], [place] [address].

## 2022-03-20 ENCOUNTER — Ambulatory Visit: Payer: Self-pay | Admitting: Family Medicine

## 2022-03-20 ENCOUNTER — Encounter: Payer: Self-pay | Admitting: Family Medicine

## 2022-03-20 DIAGNOSIS — Z113 Encounter for screening for infections with a predominantly sexual mode of transmission: Secondary | ICD-10-CM

## 2022-03-20 LAB — HM HEPATITIS C SCREENING LAB: HM Hepatitis Screen: NEGATIVE

## 2022-03-20 LAB — WET PREP FOR TRICH, YEAST, CLUE
Trichomonas Exam: NEGATIVE
Yeast Exam: NEGATIVE

## 2022-03-20 LAB — HM HIV SCREENING LAB: HM HIV Screening: NEGATIVE

## 2022-03-20 NOTE — Progress Notes (Signed)
Kimberly Hayes  STI clinic/screening visit Kimberly Hayes [id number] [phone]  Subjective:  Kimberly Hayes is a 42 y.o. female being seen today for an STI screening visit. The patient reports they do have symptoms.  Patient reports that they do not desire a pregnancy in the next year.   They reported they are not interested in discussing contraception today.    No LMP recorded. Patient has had an implant.  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  Does the patient using douching products? Practitioner oversight- forgot to ask  Last HIV test per patient/review of record was  Lab Results  Component Value Date   HMHIVSCREEN Negative - Validated 08/11/2021    Lab Results  Component Value Date   HIV Non Reactive 08/16/2017   Patient reports last pap was  Lab Results  Component Value Date   DIAGPAP  06/25/2018    NEGATIVE FOR INTRAEPITHELIAL LESIONS OR MALIGNANCY.     Screening for MPX risk: Does the patient have an unexplained rash? No Is the patient MSM? No Does the patient endorse multiple sex partners or anonymous sex partners? No Did the patient have close or sexual contact with a person diagnosed with MPX? No Has the patient traveled outside the [place] where MPX is endemic? No Is there a high clinical suspicion for MPX-- evidenced by one of the following No  -Unlikely to be chickenpox  -Lymphadenopathy  -Rash that present  in same phase of evolution on any given body part See flowsheet for further details and programmatic requirements.   Immunization history:  Immunization History  Administered Date(s) Administered   Hep A / Hep B 02/22/2014   Influenza-Unspecified 12/04/2018   Tdap 11/14/2017     The following portions of the patient's history were reviewed and updated as appropriate: allergies, current medications, past medical history, past social history, past surgical history and problem list.  Objective:  There were no vitals filed for this visit.  Physical Exam Vitals and nursing note reviewed.  Constitutional:      Appearance: Normal appearance.  HENT:     Head: Normocephalic and atraumatic.     Mouth/Throat:     Mouth: Mucous membranes are moist.     Pharynx: Oropharynx is clear. No oropharyngeal exudate or posterior oropharyngeal erythema.  Pulmonary:     Effort: Pulmonary effort is normal.  Abdominal:     General: Abdomen is flat.     Palpations: There is no mass.     Tenderness: There is no abdominal tenderness. There is no rebound.  Genitourinary:    General: Normal vulva.     Exam position: Lithotomy position.     Pubic Area: No rash or pubic lice.      Labia:        Right: No rash or lesion.        Left: No rash or lesion.      Vagina: Bleeding present. No vaginal  discharge, erythema or lesions.     Cervix: No cervical motion tenderness, discharge, friability, lesion or erythema.     Uterus: Normal.      Adnexa: Right adnexa normal and left adnexa normal.     Rectum: Normal.     Comments: pH = 4  No discharge visualized, menses in vaginal canal Lymphadenopathy:     Head:     Right side of head: No preauricular or posterior auricular adenopathy.     Left side of head: No preauricular or posterior auricular adenopathy.     Cervical: No cervical adenopathy.     Upper Body:     Right upper body: No supraclavicular, axillary or epitrochlear adenopathy.     Left upper body:  No supraclavicular, axillary or epitrochlear adenopathy.     Lower Body: No right inguinal adenopathy. No left inguinal adenopathy.  Skin:    General: Skin is warm and dry.     Findings: No rash.  Neurological:     Mental Status: She is alert and oriented to person, place, and time.      Assessment and Plan:  Kimberly Hayes is a 42 y.o. female presenting to the [facility] for STI screening  1. Screening for venereal disease Few week hx of vaginal discharge and odor. Patient reports they are concerned it could be a UTI, as they have increased urinary urgency. Encouraged patient to make an appointment with PCP for UTI testing.  - Chlamydia/Gonorrhea [place] [facility] - Syphilis Serology, [place] [facility] - WET PREP FOR [place], YEAST, [place] [facility]   Patient accepted all screenings including oral, vaginal CT/GC and bloodwork for HIV/RPR, and wet prep. Patient meets criteria for HepB screening? No. Ordered? yes Patient meets criteria for HepC screening? No. Ordered? Yes- requests testing d/t previous hx of IV drug use  Treat wet prep per standing order Discussed time line for [facility] Lab results and that patient will be called with positive results and encouraged patient to call if she had not heard in 2 weeks.  Counseled to return or seek care for continued or worsening symptoms Recommended condom use with all sex  Patient is currently using *Nexplanon to prevent pregnancy.    Return if symptoms worsen or fail to improve.  Future Appointments  Date Time Provider [place]  04/04/2022  3:30 PM AC-FP PROVIDER [facility] None  04/04/2022  3:50 PM AC-FP PROVIDER AC[facility] None    Sharlet Salina, FNP

## 2022-03-27 ENCOUNTER — Ambulatory Visit: Payer: 59 | Admitting: Family Medicine

## 2022-04-03 ENCOUNTER — Ambulatory Visit: Payer: 59

## 2022-04-03 ENCOUNTER — Ambulatory Visit (LOCAL_COMMUNITY_HEALTH_CENTER): Payer: 59 | Admitting: Family Medicine

## 2022-04-03 VITALS — BP 112/73 | Ht 65.0 in | Wt 156.0 lb

## 2022-04-03 DIAGNOSIS — Z30017 Encounter for initial prescription of implantable subdermal contraceptive: Secondary | ICD-10-CM

## 2022-04-03 DIAGNOSIS — Z3009 Encounter for other general counseling and advice on contraception: Secondary | ICD-10-CM | POA: Diagnosis not present

## 2022-04-03 DIAGNOSIS — Z3046 Encounter for surveillance of implantable subdermal contraceptive: Secondary | ICD-10-CM

## 2022-04-03 MED ORDER — ETONOGESTREL 68 MG ~~LOC~~ IMPL
68.0000 mg | DRUG_IMPLANT | Freq: Once | SUBCUTANEOUS | Status: AC
  Administered 2022-04-03: 68 mg via SUBCUTANEOUS

## 2022-04-03 NOTE — Progress Notes (Signed)
[place] problem visit  [place] Department  Subjective:  Kimberly Hayes is a 42 y.o. being seen today for nexplanon exchange.  Chief Complaint  Patient presents with   Acute Visit    Nexplanon removal and reinsertion   HPI  Does the patient have a current or past history of drug use? Yes   No components found for: "HCV"]   Health Maintenance Due  Topic Date Due   COVID-19 Vaccine (1) Never done   INFLUENZA VACCINE  10/03/2021    ROS  The following portions of the patient's history were reviewed and updated as appropriate: allergies, current medications, past family history, past medical history, past social history, past surgical history and problem list. Problem list updated.   See flowsheet for other program required questions.  Objective:   Vitals:   04/03/22 1346  BP: 112/73  Weight: 156 lb (70.8 kg)  Height: 5\' 5"  (1.651 m)    Physical Exam  Deferred- pt here for nexplanon exchange.   Assessment and Plan:  Kimberly Hayes is a 42 y.o. female presenting to the [facility] Department for a Women's Health problem visit  1. Encounter for removal and reinsertion of Nexplanon Nexplanon Removal and Insertion  Patient identified, informed consent performed, consent signed.   Patient does understand that irregular bleeding is a very common side effect of this medication. She was advised to have backup contraception for one week after replacement of the implant. Patient deemed to meet WHO criteria for being reasonably certain she is not pregnant.  Appropriate time out taken. Nexplanon site identified. Area prepped in usual sterile fashon. 3 ml of 1% lidocaine with epinephrine was used to anesthetize the area at the distal end of the implant. A small stab incision was made right beside the implant on the distal portion. The Nexplanon rod was grasped using hemostats and removed without difficulty. There was minimal blood loss.  There were no complications.   Confirmed correct location of insertion site. The insertion site was identified 8-10 cm (3-4 inches) from the medial epicondyle of the humerus and 3-5 cm (1.25-2 inches) posterior to (below) the sulcus (groove) between the biceps and triceps muscles of the patient's left arm. New Nexplanon removed from packaging, Device confirmed in needle, then inserted full length of needle and withdrawn per handbook instructions. Nexplanon was able to palpated in the patient's left arm; patient palpated the insert herself.  There was minimal blood loss. Patient insertion site covered with guaze and a pressure bandage to reduce any bruising. The patient tolerated the procedure well and was given post procedure instructions.     Nexplanon:   Counseled patient to take OTC analgesic starting as soon as lidocaine starts to wear off and take regularly for at least 48 hr to decrease discomfort.  Specifically to take with food or milk to decrease stomach upset and for IB 600 mg (3 tablets) every 6 hrs; IB 800 mg (4 tablets) every 8 hrs; or Aleve 2 tablets every 12 hrs.    Return if symptoms worsen or fail to improve.  No future appointments.  Sharlet Salina, [place]

## 2022-04-03 NOTE — Progress Notes (Signed)
Pt here for Nexplanon removal and reinsertion.  Removal and reinsertion of Nexplanon devices L arm was performed without complications by Art Buff, FNP.  Pt tolerated procedures well.  Condoms declined.  Pt states she is not currently sexually Kimberly Martin, RN

## 2022-04-03 NOTE — Addendum Note (Signed)
Addended by: Windle Guard on: 04/03/2022 02:45 PM   Modules accepted: Orders

## 2022-04-04 ENCOUNTER — Ambulatory Visit: Payer: 59

## 2022-04-04 DIAGNOSIS — F1010 Alcohol abuse, uncomplicated: Secondary | ICD-10-CM | POA: Diagnosis not present

## 2022-04-04 DIAGNOSIS — F1120 Opioid dependence, uncomplicated: Secondary | ICD-10-CM | POA: Diagnosis not present

## 2022-04-16 DIAGNOSIS — R569 Unspecified convulsions: Secondary | ICD-10-CM | POA: Diagnosis not present

## 2022-04-26 NOTE — Addendum Note (Signed)
Addended by: Sharlet Salina on: 04/26/2022 08:14 AM   Modules accepted: Level of Service

## 2022-05-02 DIAGNOSIS — N898 Other specified noninflammatory disorders of vagina: Secondary | ICD-10-CM | POA: Diagnosis not present

## 2022-05-02 DIAGNOSIS — Z113 Encounter for screening for infections with a predominantly sexual mode of transmission: Secondary | ICD-10-CM | POA: Diagnosis not present

## 2022-05-02 DIAGNOSIS — R300 Dysuria: Secondary | ICD-10-CM | POA: Diagnosis not present

## 2022-05-03 DIAGNOSIS — F1290 Cannabis use, unspecified, uncomplicated: Secondary | ICD-10-CM | POA: Diagnosis not present

## 2022-05-03 DIAGNOSIS — F1010 Alcohol abuse, uncomplicated: Secondary | ICD-10-CM | POA: Diagnosis not present

## 2022-05-03 DIAGNOSIS — F1120 Opioid dependence, uncomplicated: Secondary | ICD-10-CM | POA: Diagnosis not present

## 2022-05-16 DIAGNOSIS — F17290 Nicotine dependence, other tobacco product, uncomplicated: Secondary | ICD-10-CM | POA: Diagnosis not present

## 2022-05-16 DIAGNOSIS — Z79899 Other long term (current) drug therapy: Secondary | ICD-10-CM | POA: Diagnosis not present

## 2022-05-16 DIAGNOSIS — F419 Anxiety disorder, unspecified: Secondary | ICD-10-CM | POA: Diagnosis not present

## 2022-05-16 DIAGNOSIS — F17210 Nicotine dependence, cigarettes, uncomplicated: Secondary | ICD-10-CM | POA: Diagnosis not present

## 2022-05-16 DIAGNOSIS — G40409 Other generalized epilepsy and epileptic syndromes, not intractable, without status epilepticus: Secondary | ICD-10-CM | POA: Diagnosis not present

## 2022-05-16 DIAGNOSIS — F1911 Other psychoactive substance abuse, in remission: Secondary | ICD-10-CM | POA: Diagnosis not present

## 2022-05-16 DIAGNOSIS — R569 Unspecified convulsions: Secondary | ICD-10-CM | POA: Diagnosis not present

## 2022-05-16 DIAGNOSIS — F1290 Cannabis use, unspecified, uncomplicated: Secondary | ICD-10-CM | POA: Diagnosis not present

## 2022-05-16 DIAGNOSIS — G4761 Periodic limb movement disorder: Secondary | ICD-10-CM | POA: Diagnosis not present

## 2022-05-16 DIAGNOSIS — N951 Menopausal and female climacteric states: Secondary | ICD-10-CM | POA: Diagnosis not present

## 2022-05-17 DIAGNOSIS — R569 Unspecified convulsions: Secondary | ICD-10-CM | POA: Diagnosis not present

## 2022-05-18 DIAGNOSIS — R569 Unspecified convulsions: Secondary | ICD-10-CM | POA: Diagnosis not present

## 2022-05-19 DIAGNOSIS — R569 Unspecified convulsions: Secondary | ICD-10-CM | POA: Diagnosis not present

## 2022-05-20 DIAGNOSIS — R569 Unspecified convulsions: Secondary | ICD-10-CM | POA: Diagnosis not present

## 2022-05-22 ENCOUNTER — Telehealth: Payer: Self-pay | Admitting: *Deleted

## 2022-05-22 NOTE — Transitions of Care (Post Inpatient/ED Visit) (Signed)
   05/22/2022  Name: Kimberly Hayes MRN: [id number] DOB: May 31, 1980  Today's TOC FU Call Status: Today's TOC FU Call Status:: Successful TOC FU Call Competed TOC FU Call Complete Date: 05/22/22  Transition Care Management Follow-up Telephone Call Date of Discharge: 05/20/22 Discharge Facility: Other ([facility] [place]) Name of Other (Non[facility]) Discharge Facility: [facility] Type of Discharge: Inpatient Admission Primary Inpatient Discharge Diagnosis:: Epilepsy How have you been since you were released from the hospital?: Better (no seizure activity since discharged) Any questions or concerns?: No  Items Reviewed: Did you receive and understand the discharge instructions provided?: Yes Medications obtained and verified?: Yes (Medications Reviewed) Dietary orders reviewed?: No Do you have support at home?: Yes Name of Support/Comfort Primary Source: jessica sister  Home Care and Equipment/Supplies: [place] Ordered?: No Any new equipment or medical supplies ordered?: No  Functional Questionnaire: Do you need assistance with bathing/showering or dressing?: No Do you need assistance with meal preparation?: No Do you need assistance with eating?: No Do you have difficulty maintaining continence: No Do you need assistance with getting out of bed/getting out of a chair/moving?: No Do you have difficulty managing or taking your medications?: No  Follow up appointments reviewed: PCP Follow-up appointment confirmed?: Yes (care guide scheduled) Date of PCP follow-up appointment?: 06/01/22 Follow-up Provider: Dr Otilio Miu 2:40 PM [facility] Follow-up appointment confirmed?: Yes Date of Specialist follow-up appointment?: 06/19/22 Follow-Up Specialty Provider:: Dr Docia Chuck 12:30 [facility] Do you need transportation to your follow-up appointment?: No Do you understand care options if your condition(s) worsen?: Yes-patient verbalized understanding  SDOH  Interventions Today    Flowsheet Row Most Recent Value  SDOH Interventions   Food Insecurity Interventions Intervention Not Indicated  Housing Interventions Intervention Not Indicated  Transportation Interventions Intervention Not Indicated      Interventions Today    Flowsheet Row Most Recent Value  General Interventions   General Interventions Discussed/Reviewed General Interventions Discussed, General Interventions Reviewed, Doctor Visits  Doctor Visits Discussed/Reviewed Specialist, PCP  PCP/Specialist Visits Compliance with follow-up visit       TOC Interventions Today    Flowsheet Row Most Recent Value  TOC Interventions   TOC Interventions Discussed/Reviewed TOC Interventions Discussed, TOC Interventions Reviewed, Arranged PCP follow up less than 12 days/Care Guide scheduled        [facility] [phone]

## 2022-06-01 ENCOUNTER — Inpatient Hospital Stay: Payer: 59 | Admitting: Family Medicine

## 2022-06-03 DIAGNOSIS — F1120 Opioid dependence, uncomplicated: Secondary | ICD-10-CM | POA: Diagnosis not present

## 2022-06-19 DIAGNOSIS — Z5181 Encounter for therapeutic drug level monitoring: Secondary | ICD-10-CM | POA: Diagnosis not present

## 2022-06-19 DIAGNOSIS — Z716 Tobacco abuse counseling: Secondary | ICD-10-CM | POA: Diagnosis not present

## 2022-06-19 DIAGNOSIS — G40209 Localization-related (focal) (partial) symptomatic epilepsy and epileptic syndromes with complex partial seizures, not intractable, without status epilepticus: Secondary | ICD-10-CM | POA: Diagnosis not present

## 2022-07-03 DIAGNOSIS — F1520 Other stimulant dependence, uncomplicated: Secondary | ICD-10-CM | POA: Diagnosis not present

## 2022-07-03 DIAGNOSIS — F1010 Alcohol abuse, uncomplicated: Secondary | ICD-10-CM | POA: Diagnosis not present

## 2022-07-03 DIAGNOSIS — F1120 Opioid dependence, uncomplicated: Secondary | ICD-10-CM | POA: Diagnosis not present

## 2022-07-05 DIAGNOSIS — F17200 Nicotine dependence, unspecified, uncomplicated: Secondary | ICD-10-CM | POA: Diagnosis not present

## 2022-07-10 ENCOUNTER — Encounter: Payer: 59 | Admitting: Family Medicine

## 2022-07-18 ENCOUNTER — Other Ambulatory Visit: Payer: Self-pay

## 2022-07-18 ENCOUNTER — Ambulatory Visit
Admission: EM | Admit: 2022-07-18 | Discharge: 2022-07-18 | Disposition: A | Discharge status: Home / Self Care | Payer: 59 | Attending: Family Medicine | Admitting: Family Medicine

## 2022-07-18 DIAGNOSIS — L03114 Cellulitis of left upper limb: Secondary | ICD-10-CM

## 2022-07-18 HISTORY — DX: Epilepsy, unspecified, not intractable, without status epilepticus: G40.909

## 2022-07-18 MED ORDER — DOXYCYCLINE HYCLATE 100 MG PO CAPS: 100.0000 mg | ORAL_CAPSULE | Freq: Two times a day (BID) | ORAL | 0 refills | Status: AC

## 2022-07-18 MED ORDER — TRIAMCINOLONE ACETONIDE 0.1 % EX OINT: 1.0000 | TOPICAL_OINTMENT | Freq: Two times a day (BID) | CUTANEOUS | 0 refills | Status: AC

## 2022-07-18 NOTE — Discharge Instructions (Addendum)
Stop by the pharmacy to pick up your prescriptions.  Go to the emergency department if your rash spreads outside the lines, hasn't improved in 48 hours after starting antibiotics or you start having a fever.

## 2022-07-18 NOTE — ED Triage Notes (Signed)
Pt c/o bug bite to upper left arm. She noticed on Monday and has begun to get worse. Put hydrocortisone on the are but has not helped.  Are a is red hot and edamatous and itchy.

## 2022-07-18 NOTE — ED Provider Notes (Signed)
[facility]    CSN: [id number] Arrival date & time: 07/18/22  0957      History   Chief Complaint Chief Complaint  Patient presents with   Insect Bite    HPI Kimberly Hayes is a 42 y.o. female.   HPI  History from: patient. Kimberly Hayes is a 42 y.o. female who reports her left upper arm is swollen and red. Symptoms started late Monday night. She thinks something biting her but didn't find a bug.  She has a Nexplanon that was placed in the same arm a few months ago. No fever, joint pain, headache, abdominal pain, neck pain, vomiting or diarrhea. She put some hydrocortisone on the area without relief. The area itches.     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 Authorizing Provider  doxycycline (VIBRAMYCIN) 100 MG capsule Take 1 capsule (100 mg total) by mouth 2 (two) times daily. 07/18/22  Yes Zyanna Leisinger, DO  triamcinolone ointment (KENALOG) 0.1 % Apply 1 Application topically 2 (two) times daily. 07/18/22  Yes Quasean Frye, DO  ARIPiprazole (ABILIFY) 10 MG tablet Take 10 mg by mouth daily.    [provider]  Buprenorphine HCl-Naloxone HCl 8-2 MG FILM DISSOLVE 1 FILM UNDER  THE TONGUE TWO TIMES A DAY 06/17/17   [provider]  Etonogestrel (NEXPLANON [place]) Inject into the skin. 10/30/19 10/30/22  Matt Holmes, PA  gabapentin (NEURONTIN) 100 MG capsule  08/25/19   [provider]  lamoTRIgine (LAMICTAL) 200 MG tablet Take 300 mg by mouth daily.    [provider]  NARCAN 4 MG/0.1ML LIQD nasal spray kit  04/30/18   [provider]  sertraline (ZOLOFT) 50 MG tablet Take 50 mg by mouth daily. 05/16/19   [provider]    Family History Family History  Problem Relation Age of Onset   Autoimmune disease Mother    Immunodeficiency Mother    Hypertension Mother    Depression Mother    Anxiety disorder Mother    Bipolar disorder Mother    Thyroid disease Sister    Cancer Maternal Grandmother    Diabetes Maternal Grandmother    Hypertension Half-Sister     Social History Social History   Tobacco Use   Smoking status: Every Day    Packs/day: 1.00    Years: 3.00    Additional pack years: 0.00  Total pack years: 3.00    Types: E-cigarettes, Cigarettes    Last attempt to quit: 08/17/2015    Years since quitting: 6.9   Smokeless tobacco: Never   Tobacco comments:    smoking cessation discussed  Vaping Use   Vaping Use: Some days  Substance Use Topics   Alcohol use: Not Currently   Drug use: Not Currently    Comment: recovering Heroin Addict      Allergies   Patient has no known allergies.   Review of Systems Review of Systems : :negative unless otherwise stated in HPI.      Physical Exam Triage Vital Signs ED Triage Vitals  Enc Vitals Group     BP 07/18/22 1008 113/78     Pulse Rate 07/18/22 1008 89     Resp 07/18/22 1008 20     Temp 07/18/22 1008 98 F (36.7 C)     Temp src --      SpO2 07/18/22 1008 99 %     Weight --      Height --      Head Circumference --      Peak Flow --      Pain Score 07/18/22 1006 0     Pain Loc --      Pain Edu? --      Excl. in GC? --    No data  found.  Updated Vital Signs BP 113/78 (BP Location: Right Arm)   Pulse 89   Temp 98 F (36.7 C)   Resp 20   LMP  (LMP Unknown) Comment: Menses is irregular because of implant  SpO2 99%   Visual Acuity Right Eye Distance:   Left Eye Distance:   Bilateral Distance:    Right Eye Near:   Left Eye Near:    Bilateral Near:     Physical Exam  GEN: alert, well appearing female, in no acute distress  EYES: extra occular movements intact, no scleral injection CV: regular rate,  radial pulse palpable  RESP: no increased work of breathing MSK: normal ROM of LUE with mild edema, no gross deformities NEURO: alert, moves all extremities appropriately PSYCH: Normal affect, appropriate speech and behavior  SKIN: warm and dry; large erythematous patch with honey crusted papules on LUE posterior arm with warm, no fluctance, Nexplanon palpable       UC Treatments / Results  Labs (all labs ordered are listed, but only abnormal results are displayed) Labs Reviewed - No data to display  EKG   Radiology No results found.  Procedures Procedures (including critical care time)  Medications Ordered in UC Medications - No data to display  Initial Impression / Assessment and Plan / UC Course  I have reviewed the triage vital signs and the nursing notes.  Pertinent labs & imaging results that were available during my care of the patient were reviewed by me and considered in my medical decision making (see chart for details).      Pt is a 42 y.o. female who presents after presumed insect bite.  Overall, Braden is well appearing, well hydrated and in no respiratory distress.  VSS and pt is afebrile. On exam, she has erythematous patch on posterior upper left arm concerning for cellulitis vs contact dermatitis.  Prescribed doxycyline for  10 days with kenalog ointment. ED precautions given.   Reviewed expectations re: course of current medical issues. Questions answered.  Outlined signs  and symptoms indicating need for more acute intervention. Patient verbalized understanding. After  Visit Summary given.   Final Clinical Impressions(s) / UC Diagnoses   Final diagnoses:  Cellulitis of left upper extremity     Discharge Instructions      Stop by the pharmacy to pick up your prescriptions.  Go to the emergency department if your rash spreads outside the lines, hasn't improved in 48 hours after starting antibiotics or you start having a fever.      ED Prescriptions     Medication Sig Dispense Auth. Provider   doxycycline (VIBRAMYCIN) 100 MG capsule Take 1 capsule (100 mg total) by mouth 2 (two) times daily. 20 capsule Anaid Haney, DO   triamcinolone ointment (KENALOG) 0.1 % Apply 1 Application topically 2 (two) times daily. 30 g Katha Cabal, DO      PDMP not reviewed this encounter.              Katha Cabal, DO 07/18/22 1048

## 2022-08-03 DIAGNOSIS — F419 Anxiety disorder, unspecified: Secondary | ICD-10-CM | POA: Diagnosis not present

## 2022-08-03 DIAGNOSIS — F1120 Opioid dependence, uncomplicated: Secondary | ICD-10-CM | POA: Diagnosis not present

## 2022-08-03 DIAGNOSIS — F32A Depression, unspecified: Secondary | ICD-10-CM | POA: Diagnosis not present

## 2022-08-22 DIAGNOSIS — F411 Generalized anxiety disorder: Secondary | ICD-10-CM | POA: Diagnosis not present

## 2022-08-22 DIAGNOSIS — G40909 Epilepsy, unspecified, not intractable, without status epilepticus: Secondary | ICD-10-CM | POA: Diagnosis not present

## 2022-09-02 DIAGNOSIS — F1120 Opioid dependence, uncomplicated: Secondary | ICD-10-CM | POA: Diagnosis not present

## 2022-09-10 DIAGNOSIS — G40209 Localization-related (focal) (partial) symptomatic epilepsy and epileptic syndromes with complex partial seizures, not intractable, without status epilepticus: Secondary | ICD-10-CM | POA: Diagnosis not present

## 2022-09-10 DIAGNOSIS — Z5181 Encounter for therapeutic drug level monitoring: Secondary | ICD-10-CM | POA: Diagnosis not present

## 2022-10-03 DIAGNOSIS — F1120 Opioid dependence, uncomplicated: Secondary | ICD-10-CM | POA: Diagnosis not present

## 2022-11-03 DIAGNOSIS — F1120 Opioid dependence, uncomplicated: Secondary | ICD-10-CM | POA: Diagnosis not present

## 2022-11-03 DIAGNOSIS — F32A Depression, unspecified: Secondary | ICD-10-CM | POA: Diagnosis not present

## 2022-12-03 DIAGNOSIS — F1120 Opioid dependence, uncomplicated: Secondary | ICD-10-CM | POA: Diagnosis not present

## 2022-12-11 DIAGNOSIS — G40919 Epilepsy, unspecified, intractable, without status epilepticus: Secondary | ICD-10-CM | POA: Diagnosis not present

## 2022-12-11 DIAGNOSIS — G40209 Localization-related (focal) (partial) symptomatic epilepsy and epileptic syndromes with complex partial seizures, not intractable, without status epilepticus: Secondary | ICD-10-CM | POA: Diagnosis not present

## 2022-12-11 DIAGNOSIS — F17200 Nicotine dependence, unspecified, uncomplicated: Secondary | ICD-10-CM | POA: Diagnosis not present

## 2022-12-11 DIAGNOSIS — Z5181 Encounter for therapeutic drug level monitoring: Secondary | ICD-10-CM | POA: Diagnosis not present

## 2023-01-03 DIAGNOSIS — F1120 Opioid dependence, uncomplicated: Secondary | ICD-10-CM | POA: Diagnosis not present

## 2023-01-03 DIAGNOSIS — F32A Depression, unspecified: Secondary | ICD-10-CM | POA: Diagnosis not present

## 2023-01-03 DIAGNOSIS — F419 Anxiety disorder, unspecified: Secondary | ICD-10-CM | POA: Diagnosis not present

## 2023-01-03 DIAGNOSIS — F1520 Other stimulant dependence, uncomplicated: Secondary | ICD-10-CM | POA: Diagnosis not present

## 2023-02-12 ENCOUNTER — Other Ambulatory Visit: Payer: Self-pay

## 2023-02-12 ENCOUNTER — Emergency Department
Admission: EM | Admit: 2023-02-12 | Discharge: 2023-02-12 | Disposition: A | Discharge status: Home / Self Care | Payer: 59 | Attending: Emergency Medicine | Admitting: Emergency Medicine

## 2023-02-12 DIAGNOSIS — R569 Unspecified convulsions: Secondary | ICD-10-CM | POA: Insufficient documentation

## 2023-02-12 DIAGNOSIS — R9431 Abnormal electrocardiogram [ECG] [EKG]: Secondary | ICD-10-CM | POA: Diagnosis not present

## 2023-02-12 DIAGNOSIS — R231 Pallor: Secondary | ICD-10-CM | POA: Diagnosis not present

## 2023-02-12 LAB — COMPREHENSIVE METABOLIC PANEL
ALT: 14 U/L (ref 0–44)
AST: 17 U/L (ref 15–41)
Albumin: 4.4 g/dL (ref 3.5–5.0)
Alkaline Phosphatase: 74 U/L (ref 38–126)
Anion gap: 9 (ref 5–15)
BUN: 22 mg/dL — ABNORMAL HIGH (ref 6–20)
CO2: 26 mmol/L (ref 22–32)
Calcium: 9 mg/dL (ref 8.9–10.3)
Chloride: 98 mmol/L (ref 98–111)
Creatinine, Ser: 0.93 mg/dL (ref 0.44–1.00)
GFR, Estimated: >60 mL/min (ref >60)
Glucose, Bld: 98 mg/dL (ref 70–99)
Potassium: 3.4 mmol/L — ABNORMAL LOW (ref 3.5–5.1)
Sodium: 133 mmol/L — ABNORMAL LOW (ref 135–145)
Total Bilirubin: 0.5 mg/dL (ref <1.2)
Total Protein: 7.3 g/dL (ref 6.5–8.1)

## 2023-02-12 LAB — CBC WITH DIFFERENTIAL/PLATELET
Abs Immature Granulocytes: 0.02 10*3/uL (ref 0.00–0.07)
Basophils Absolute: 0.1 10*3/uL (ref 0.0–0.1)
Basophils Relative: 1 %
Eosinophils Absolute: 0.1 10*3/uL (ref 0.0–0.5)
Eosinophils Relative: 1 %
HCT: 38.6 % (ref 36.0–46.0)
Hemoglobin: 13.1 g/dL (ref 12.0–15.0)
Immature Granulocytes: 0 %
Lymphocytes Relative: 24 %
Lymphs Abs: 2 10*3/uL (ref 0.7–4.0)
MCH: 30.9 pg (ref 26.0–34.0)
MCHC: 33.9 g/dL (ref 30.0–36.0)
MCV: 91 fL (ref 80.0–100.0)
Monocytes Absolute: 0.4 10*3/uL (ref 0.1–1.0)
Monocytes Relative: 5 %
Neutro Abs: 5.6 10*3/uL (ref 1.7–7.7)
Neutrophils Relative %: 69 %
Platelets: 319 10*3/uL (ref 150–400)
RBC: 4.24 MIL/uL (ref 3.87–5.11)
RDW: 12.9 % (ref 11.5–15.5)
WBC: 8.2 10*3/uL (ref 4.0–10.5)
nRBC: 0 % (ref 0.0–0.2)

## 2023-02-12 LAB — TSH: TSH: 3.32 u[IU]/mL (ref 0.350–4.500)

## 2023-02-12 LAB — POC URINE PREG, ED: Preg Test, Ur: NEGATIVE

## 2023-02-12 LAB — T4, FREE: Free T4: 1.08 ng/dL (ref 0.61–1.12)

## 2023-02-12 LAB — MAGNESIUM: Magnesium: 2.3 mg/dL (ref 1.7–2.4)

## 2023-02-12 NOTE — ED Notes (Signed)
First nurse note: Pt here via [facility] from work with c/o of seizure, pt has HX of same. Per EMS pt awake and alert at this time.   HR: 60-70 BP: 125/80 100%

## 2023-02-12 NOTE — ED Notes (Signed)
 Patient discharged from ED by provider. Discharge instructions reviewed with patient and all questions answered. Patient ambulatory from ED in NAD.

## 2023-02-12 NOTE — ED Notes (Signed)
See triage note. Pt reports has had a couple focal seizures over the past few days. Alert and oriented. Denies missing any doses of seizures meds. Denies loss of bowel or bladder function

## 2023-02-12 NOTE — ED Provider Notes (Signed)
[facility] Provider Note    Event Date/Time   First MD Initiated Contact with Patient 02/12/23 1029     (approximate)   History   Seizures   HPI  Kimberly Hayes is a 42 y.o. 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  Consistent with her prior seizures, stares off into space and has a brief period of confusion.  Feels completely normal now.  No recent illnesses, no injuries, has been compliant with medications.   She did use methamphetamine on Friday which preceded these episodes occurring.  She used it because she is now on third shift on a new job.  No other drug use no other alcohol use.   External Medical Documents Reviewed: Neurology note documenting her past medical history of focal seizures, EEG results, and medications      Physical Exam   Triage Vital Signs: ED Triage Vitals  Encounter Vitals Group     BP 02/12/23 1012 120/73     Systolic BP Percentile --      Diastolic BP Percentile --      Pulse Rate 02/12/23 1012 76     Resp 02/12/23 1012 17     Temp 02/12/23 1012 98.3 F (36.8 C)     Temp Source 02/12/23 1012 Oral     SpO2 02/12/23 1012 99 %     Weight 02/12/23 1013 156 lb 1.4 oz (70.8 kg)     Height 02/12/23 1013 5\' 5"  (1.651 m)     Head Circumference --      Peak Flow --      Pain Score 02/12/23 1013 0     Pain Loc --      Pain Education --      Exclude from Growth Chart --     Most recent vital signs: Vitals:   02/12/23 1012  BP: 120/73  Pulse: 76  Resp: 17  Temp: 98.3 F (36.8 C)  SpO2: 99%    General: Awake, no distress.  CV:  Good peripheral perfusion.  Resp:  Normal effort.  Abd:  No distention.  Other:  Awake alert appropriate pleasant in no acute distress with normal vital signs.  No focal neurologic deficits including facial asymmetry dysarthria finger-nose motor or sensory  deficits.   ED Results / Procedures / Treatments   Labs (all labs ordered are listed, but only abnormal results are displayed) Labs Reviewed  COMPREHENSIVE METABOLIC PANEL - Abnormal; Notable for the following components:      Result Value   Sodium 133 (*)    Potassium 3.4 (*)    BUN 22 (*)    All other components within normal limits  CBC WITH DIFFERENTIAL/PLATELET  TSH  T4, FREE  MAGNESIUM  LAMOTRIGINE LEVEL  POC URINE PREG, ED     I ordered and reviewed the above labs they are notable for labs including cell counts electrolytes thyroid function all within normal limit  EKG  ED ECG REPORT I, Pilar Jarvis, the attending physician, personally viewed and interpreted this ECG.   Date: 02/12/2023  EKG Time: 1019  Rate: 74  Rhythm: sinus  Axis: nl  Intervals:none  ST&T Change:  no stemi    PROCEDURES:  Critical Care performed: No  Procedures   MEDICATIONS ORDERED IN ED: Medications - No data to display  IMPRESSION / MDM / ASSESSMENT AND PLAN / ED COURSE  I reviewed the triage vital signs and the nursing notes.                                Patient's presentation is most consistent with acute presentation with potential threat to life or bodily function.  Differential diagnosis includes, but is not limited to, breakthrough seizures in the setting of medication subtherapeutic, electrolyte disturbances, infection, drug use   The patient is on the cardiac monitor to evaluate for evidence of arrhythmia and/or significant heart rate changes.  MDM:    Most likely breakthrough seizures in the setting of methamphetamine use.  No current seizures.  Feels well.  Normal testing including electrolytes lab testing pregnancy testing.  Seizure precautions given.  Follow-up with neurologist.  Avoid drugs.        FINAL CLINICAL IMPRESSION(S) / ED DIAGNOSES   Final diagnoses:  Seizure (HCC)     Rx / DC Orders   ED Discharge Orders     None         Note:  This document was prepared using Dragon voice recognition software and may include unintentional dictation errors.    Pilar Jarvis, MD 02/12/23 [phone]

## 2023-02-12 NOTE — ED Triage Notes (Signed)
Pt here with a seizure less than an hour ago. Pt states she had a focal seizure, hx of same. Pt has also been compliant with medications. Pt denies LOC. Pt denies pain, N, V, and D.

## 2023-02-12 NOTE — Discharge Instructions (Signed)
Continue taking all seizure medications and as we discussed avoid any triggers that may make seizure activity more likely.  Continue to avoid driving until you are further advised by your neurologist.  Avoid any other activities that may put you or others in danger if you were to have another seizure episode.  Thank you for choosing [place] for your health care today!  Please see your primary doctor this week for a follow up appointment.   If you have any new, worsening, or unexpected symptoms call your doctor right away or come back to the emergency department for reevaluation.  It was my pleasure to care for you today.   Daneil Dan [place] Charon, MD

## 2023-02-13 LAB — LAMOTRIGINE LEVEL: Lamotrigine Lvl: 2.9 ug/mL (ref 2.0–20.0)

## 2023-03-18 DIAGNOSIS — G40919 Epilepsy, unspecified, intractable, without status epilepticus: Secondary | ICD-10-CM | POA: Diagnosis not present

## 2023-03-18 DIAGNOSIS — Z91138 Patient's unintentional underdosing of medication regimen for other reason: Secondary | ICD-10-CM | POA: Diagnosis not present

## 2023-03-18 DIAGNOSIS — G40209 Localization-related (focal) (partial) symptomatic epilepsy and epileptic syndromes with complex partial seizures, not intractable, without status epilepticus: Secondary | ICD-10-CM | POA: Diagnosis not present

## 2023-04-05 DIAGNOSIS — F1120 Opioid dependence, uncomplicated: Secondary | ICD-10-CM | POA: Diagnosis not present

## 2023-04-05 DIAGNOSIS — Z789 Other specified health status: Secondary | ICD-10-CM | POA: Diagnosis not present

## 2023-04-25 DIAGNOSIS — G40009 Localization-related (focal) (partial) idiopathic epilepsy and epileptic syndromes with seizures of localized onset, not intractable, without status epilepticus: Secondary | ICD-10-CM | POA: Diagnosis not present

## 2023-04-25 DIAGNOSIS — F1120 Opioid dependence, uncomplicated: Secondary | ICD-10-CM | POA: Diagnosis not present

## 2023-04-25 DIAGNOSIS — F418 Other specified anxiety disorders: Secondary | ICD-10-CM | POA: Diagnosis not present

## 2023-05-01 DIAGNOSIS — F1120 Opioid dependence, uncomplicated: Secondary | ICD-10-CM | POA: Diagnosis not present

## 2023-05-03 DIAGNOSIS — F418 Other specified anxiety disorders: Secondary | ICD-10-CM | POA: Diagnosis not present

## 2023-05-03 DIAGNOSIS — G40009 Localization-related (focal) (partial) idiopathic epilepsy and epileptic syndromes with seizures of localized onset, not intractable, without status epilepticus: Secondary | ICD-10-CM | POA: Diagnosis not present

## 2023-05-03 DIAGNOSIS — F1120 Opioid dependence, uncomplicated: Secondary | ICD-10-CM | POA: Diagnosis not present

## 2023-05-07 DIAGNOSIS — F1120 Opioid dependence, uncomplicated: Secondary | ICD-10-CM | POA: Diagnosis not present

## 2023-05-14 DIAGNOSIS — F1120 Opioid dependence, uncomplicated: Secondary | ICD-10-CM | POA: Diagnosis not present

## 2023-05-24 DIAGNOSIS — F1120 Opioid dependence, uncomplicated: Secondary | ICD-10-CM | POA: Diagnosis not present

## 2023-05-26 DIAGNOSIS — F1120 Opioid dependence, uncomplicated: Secondary | ICD-10-CM | POA: Diagnosis not present

## 2023-05-26 DIAGNOSIS — Z659 Problem related to unspecified psychosocial circumstances: Secondary | ICD-10-CM | POA: Diagnosis not present

## 2023-05-26 DIAGNOSIS — Z7189 Other specified counseling: Secondary | ICD-10-CM | POA: Diagnosis not present

## 2023-06-02 DIAGNOSIS — F1120 Opioid dependence, uncomplicated: Secondary | ICD-10-CM | POA: Diagnosis not present

## 2023-06-02 DIAGNOSIS — Z659 Problem related to unspecified psychosocial circumstances: Secondary | ICD-10-CM | POA: Diagnosis not present

## 2023-06-02 DIAGNOSIS — F1521 Other stimulant dependence, in remission: Secondary | ICD-10-CM | POA: Diagnosis not present

## 2023-06-02 DIAGNOSIS — Z7189 Other specified counseling: Secondary | ICD-10-CM | POA: Diagnosis not present

## 2023-06-03 DIAGNOSIS — F1120 Opioid dependence, uncomplicated: Secondary | ICD-10-CM | POA: Diagnosis not present

## 2023-06-04 DIAGNOSIS — F1120 Opioid dependence, uncomplicated: Secondary | ICD-10-CM | POA: Diagnosis not present

## 2023-06-11 DIAGNOSIS — F1521 Other stimulant dependence, in remission: Secondary | ICD-10-CM | POA: Diagnosis not present

## 2023-06-11 DIAGNOSIS — F1120 Opioid dependence, uncomplicated: Secondary | ICD-10-CM | POA: Diagnosis not present

## 2023-06-17 DIAGNOSIS — F1521 Other stimulant dependence, in remission: Secondary | ICD-10-CM | POA: Diagnosis not present

## 2023-06-17 DIAGNOSIS — F1120 Opioid dependence, uncomplicated: Secondary | ICD-10-CM | POA: Diagnosis not present

## 2023-07-01 DIAGNOSIS — F1120 Opioid dependence, uncomplicated: Secondary | ICD-10-CM | POA: Diagnosis not present

## 2023-07-01 DIAGNOSIS — Z5181 Encounter for therapeutic drug level monitoring: Secondary | ICD-10-CM | POA: Diagnosis not present

## 2023-07-01 DIAGNOSIS — F1521 Other stimulant dependence, in remission: Secondary | ICD-10-CM | POA: Diagnosis not present

## 2023-07-01 DIAGNOSIS — G40919 Epilepsy, unspecified, intractable, without status epilepticus: Secondary | ICD-10-CM | POA: Diagnosis not present

## 2023-07-02 DIAGNOSIS — G40209 Localization-related (focal) (partial) symptomatic epilepsy and epileptic syndromes with complex partial seizures, not intractable, without status epilepticus: Secondary | ICD-10-CM | POA: Diagnosis not present

## 2023-07-03 DIAGNOSIS — F1521 Other stimulant dependence, in remission: Secondary | ICD-10-CM | POA: Diagnosis not present

## 2023-07-03 DIAGNOSIS — F1120 Opioid dependence, uncomplicated: Secondary | ICD-10-CM | POA: Diagnosis not present

## 2023-07-10 DIAGNOSIS — F1120 Opioid dependence, uncomplicated: Secondary | ICD-10-CM | POA: Diagnosis not present

## 2023-07-10 DIAGNOSIS — F1510 Other stimulant abuse, uncomplicated: Secondary | ICD-10-CM | POA: Diagnosis not present

## 2023-07-17 DIAGNOSIS — F1120 Opioid dependence, uncomplicated: Secondary | ICD-10-CM | POA: Diagnosis not present

## 2023-07-17 DIAGNOSIS — F1510 Other stimulant abuse, uncomplicated: Secondary | ICD-10-CM | POA: Diagnosis not present

## 2023-07-21 DIAGNOSIS — F1120 Opioid dependence, uncomplicated: Secondary | ICD-10-CM | POA: Diagnosis not present

## 2023-07-24 DIAGNOSIS — F1510 Other stimulant abuse, uncomplicated: Secondary | ICD-10-CM | POA: Diagnosis not present

## 2023-07-24 DIAGNOSIS — F1120 Opioid dependence, uncomplicated: Secondary | ICD-10-CM | POA: Diagnosis not present

## 2023-07-30 DIAGNOSIS — F1120 Opioid dependence, uncomplicated: Secondary | ICD-10-CM | POA: Diagnosis not present

## 2023-08-03 DIAGNOSIS — F1120 Opioid dependence, uncomplicated: Secondary | ICD-10-CM | POA: Diagnosis not present

## 2023-08-03 DIAGNOSIS — F1510 Other stimulant abuse, uncomplicated: Secondary | ICD-10-CM | POA: Diagnosis not present

## 2023-08-04 DIAGNOSIS — F1120 Opioid dependence, uncomplicated: Secondary | ICD-10-CM | POA: Diagnosis not present

## 2023-08-04 DIAGNOSIS — F1510 Other stimulant abuse, uncomplicated: Secondary | ICD-10-CM | POA: Diagnosis not present

## 2023-08-10 DIAGNOSIS — F1120 Opioid dependence, uncomplicated: Secondary | ICD-10-CM | POA: Diagnosis not present

## 2023-08-12 DIAGNOSIS — F1120 Opioid dependence, uncomplicated: Secondary | ICD-10-CM | POA: Diagnosis not present

## 2023-08-12 DIAGNOSIS — F1510 Other stimulant abuse, uncomplicated: Secondary | ICD-10-CM | POA: Diagnosis not present

## 2023-08-19 DIAGNOSIS — F1120 Opioid dependence, uncomplicated: Secondary | ICD-10-CM | POA: Diagnosis not present

## 2023-08-19 DIAGNOSIS — F1510 Other stimulant abuse, uncomplicated: Secondary | ICD-10-CM | POA: Diagnosis not present

## 2023-08-21 DIAGNOSIS — Z1231 Encounter for screening mammogram for malignant neoplasm of breast: Secondary | ICD-10-CM | POA: Diagnosis not present

## 2023-08-21 DIAGNOSIS — Z113 Encounter for screening for infections with a predominantly sexual mode of transmission: Secondary | ICD-10-CM | POA: Diagnosis not present

## 2023-08-21 DIAGNOSIS — N939 Abnormal uterine and vaginal bleeding, unspecified: Secondary | ICD-10-CM | POA: Diagnosis not present

## 2023-08-21 DIAGNOSIS — Z131 Encounter for screening for diabetes mellitus: Secondary | ICD-10-CM | POA: Diagnosis not present

## 2023-08-21 DIAGNOSIS — Z1330 Encounter for screening examination for mental health and behavioral disorders, unspecified: Secondary | ICD-10-CM | POA: Diagnosis not present

## 2023-08-21 DIAGNOSIS — F1910 Other psychoactive substance abuse, uncomplicated: Secondary | ICD-10-CM | POA: Diagnosis not present

## 2023-08-21 DIAGNOSIS — Z1159 Encounter for screening for other viral diseases: Secondary | ICD-10-CM | POA: Diagnosis not present

## 2023-08-21 DIAGNOSIS — Z1331 Encounter for screening for depression: Secondary | ICD-10-CM | POA: Diagnosis not present

## 2023-08-21 DIAGNOSIS — G40209 Localization-related (focal) (partial) symptomatic epilepsy and epileptic syndromes with complex partial seizures, not intractable, without status epilepticus: Secondary | ICD-10-CM | POA: Diagnosis not present

## 2023-08-21 DIAGNOSIS — F1120 Opioid dependence, uncomplicated: Secondary | ICD-10-CM | POA: Diagnosis not present

## 2023-08-21 DIAGNOSIS — Z136 Encounter for screening for cardiovascular disorders: Secondary | ICD-10-CM | POA: Diagnosis not present

## 2023-08-21 DIAGNOSIS — Z130 Encounter for screening for diseases of the blood and blood-forming organs and certain disorders involving the immune mechanism: Secondary | ICD-10-CM | POA: Diagnosis not present

## 2023-08-21 DIAGNOSIS — Z114 Encounter for screening for human immunodeficiency virus [HIV]: Secondary | ICD-10-CM | POA: Diagnosis not present

## 2023-09-02 DIAGNOSIS — F1120 Opioid dependence, uncomplicated: Secondary | ICD-10-CM | POA: Diagnosis not present

## 2023-09-02 DIAGNOSIS — F1510 Other stimulant abuse, uncomplicated: Secondary | ICD-10-CM | POA: Diagnosis not present

## 2023-09-22 DIAGNOSIS — F1120 Opioid dependence, uncomplicated: Secondary | ICD-10-CM | POA: Diagnosis not present

## 2023-09-23 DIAGNOSIS — F1120 Opioid dependence, uncomplicated: Secondary | ICD-10-CM | POA: Diagnosis not present

## 2023-10-21 DIAGNOSIS — G40209 Localization-related (focal) (partial) symptomatic epilepsy and epileptic syndromes with complex partial seizures, not intractable, without status epilepticus: Secondary | ICD-10-CM | POA: Diagnosis not present
